# Patient Record
Sex: Male | Born: 1955 | Race: White | Hispanic: No | Marital: Married | Smoking: Never smoker
Health system: Southern US, Community
[De-identification: ages and names within clinical notes are randomized; demographics above are authoritative.]

## PROBLEM LIST (undated history)

## (undated) DIAGNOSIS — H9192 Unspecified hearing loss, left ear: Secondary | ICD-10-CM

## (undated) DIAGNOSIS — R918 Other nonspecific abnormal finding of lung field: Secondary | ICD-10-CM

## (undated) DIAGNOSIS — M1990 Unspecified osteoarthritis, unspecified site: Secondary | ICD-10-CM

## (undated) DIAGNOSIS — J189 Pneumonia, unspecified organism: Secondary | ICD-10-CM

## (undated) DIAGNOSIS — C801 Malignant (primary) neoplasm, unspecified: Secondary | ICD-10-CM

## (undated) DIAGNOSIS — N179 Acute kidney failure, unspecified: Secondary | ICD-10-CM

## (undated) DIAGNOSIS — I10 Essential (primary) hypertension: Secondary | ICD-10-CM

## (undated) DIAGNOSIS — Z9289 Personal history of other medical treatment: Secondary | ICD-10-CM

## (undated) HISTORY — PX: TONSILLECTOMY: SUR1361

## (undated) HISTORY — PX: OTHER SURGICAL HISTORY: SHX169

---

## 2004-07-04 ENCOUNTER — Ambulatory Visit (HOSPITAL_COMMUNITY): Admission: RE | Admit: 2004-07-04 | Discharge: 2004-07-04 | Payer: Self-pay | Admitting: Family Medicine

## 2014-07-13 ENCOUNTER — Other Ambulatory Visit: Payer: Self-pay | Admitting: Urology

## 2014-07-20 NOTE — Patient Instructions (Addendum)
Jaime Singh  07/20/2014   Your procedure is scheduled on:   -07-29-2014 Friday  Enter through [facility]  Entrance and follow signs to [facility]. Arrive at       0900 AM.  Call this number if you have problems the morning of surgery: [phone]  Or Presurgical Testing [phone].   For Living Will and/or Health Care Power Attorney Forms: please provide copy for your medical record,may bring AM of surgery(Forms should be already notarized -we do not provide this service).(07-21-14  No information preferred today-in process of this).  Remember: Follow any bowel prep instructions per MD office.    Do not eat food/ or drink: After Midnight.     Take these medicines the morning of surgery with A SIP OF WATER: None.   Do not wear jewelry, make-up or nail polish.  Do not wear deodorant, lotions, powders, or perfumes.   Do not shave legs and under arms- 48 hours(2 days) prior to first CHG shower.(Shaving face and neck okay.)  Do not bring valuables to the hospital.(Hospital is not responsible for lost valuables).  Contacts, dentures or removable bridgework, body piercing, hair pins may not be worn into surgery.  Leave suitcase in the car. After surgery it may be brought to your room.  For patients admitted to the hospital, checkout time is 11:00 AM the day of discharge.(Restricted visitors-Any Persons displaying flu-like symptoms or illness).    Patients discharged the day of surgery will not be allowed to drive home. Must have responsible person with you x 24 hours once discharged.  Name and phone number of your driver: Pauline-spouse [phone] cell     Please read over the following fact sheets that you were given:  CHG(Chlorhexidine Gluconate 4% Surgical Soap) use, MRSA Information, Blood Transfusion fact sheet, Incentive Spirometry Instruction.           [place] - Preparing for Surgery Before surgery, you can play an important role.  Because skin is  not sterile, your skin needs to be as free of germs as possible.  You can reduce the number of germs on your skin by washing with CHG (chlorahexidine gluconate) soap before surgery.  CHG is an antiseptic cleaner which kills germs and bonds with the skin to continue killing germs even after washing. Please DO NOT use if you have an allergy to CHG or antibacterial soaps.  If your skin becomes reddened/irritated stop using the CHG and inform your nurse when you arrive at Short Stay. Do not shave (including legs and underarms) for at least 48 hours prior to the first CHG shower.  You may shave your face/neck. Please follow these instructions carefully:  1.  Shower with CHG Soap the night before surgery and the  morning of Surgery.  2.  If you choose to wash your hair, wash your hair first as usual with your  normal  shampoo.  3.  After you shampoo, rinse your hair and body thoroughly to remove the  shampoo.                           4.  Use CHG as you would any other liquid soap.  You can apply chg directly  to the skin and wash                       Gently with a scrungie or clean washcloth.  5.  Apply the  CHG Soap to your body ONLY FROM THE NECK DOWN.   Do not use on face/ open                           Wound or open sores. Avoid contact with eyes, ears mouth and genitals (private parts).                       Wash face,  Genitals (private parts) with your normal soap.             6.  Wash thoroughly, paying special attention to the area where your surgery  will be performed.  7.  Thoroughly rinse your body with warm water from the neck down.  8.  DO NOT shower/wash with your normal soap after using and rinsing off  the CHG Soap.                9.  Pat yourself dry with a clean towel.            10.  Wear clean pajamas.            11.  Place clean sheets on your bed the night of your first shower and do not  sleep with pets. Day of Surgery : Do not apply any lotions/deodorants the morning of surgery.   Please wear clean clothes to the hospital/surgery center.  FAILURE TO FOLLOW THESE INSTRUCTIONS MAY RESULT IN THE CANCELLATION OF YOUR SURGERY PATIENT SIGNATURE_________________________________  NURSE SIGNATURE__________________________________  ________________________________________________________________________   Adam Phenix  An incentive spirometer is a tool that can help keep your lungs clear and active. This tool measures how well you are filling your lungs with each breath. Taking long deep breaths may help reverse or decrease the chance of developing breathing (pulmonary) problems (especially infection) following:  A long period of time when you are unable to move or be active. BEFORE THE PROCEDURE   If the spirometer includes an indicator to show your best effort, your nurse or respiratory therapist will set it to a desired goal.  If possible, sit up straight or lean slightly forward. Try not to slouch.  Hold the incentive spirometer in an upright position. INSTRUCTIONS FOR USE  1. Sit on the edge of your bed if possible, or sit up as far as you can in bed or on a chair. 2. Hold the incentive spirometer in an upright position. 3. Breathe out normally. 4. Place the mouthpiece in your mouth and seal your lips tightly around it. 5. Breathe in slowly and as deeply as possible, raising the piston or the ball toward the top of the column. 6. Hold your breath for 3-5 seconds or for as long as possible. Allow the piston or ball to fall to the bottom of the column. 7. Remove the mouthpiece from your mouth and breathe out normally. 8. Rest for a few seconds and repeat Steps 1 through 7 at least 10 times every 1-2 hours when you are awake. Take your time and take a few normal breaths between deep breaths. 9. The spirometer may include an indicator to show your best effort. Use the indicator as a goal to work toward during each repetition. 10. After each set of 10 deep  breaths, practice coughing to be sure your lungs are clear. If you have an incision (the cut made at the time of surgery), support your incision when coughing by placing a pillow or rolled  up towels firmly against it. Once you are able to get out of bed, walk around indoors and cough well. You may stop using the incentive spirometer when instructed by your caregiver.  RISKS AND COMPLICATIONS  Take your time so you do not get dizzy or light-headed.  If you are in pain, you may need to take or ask for pain medication before doing incentive spirometry. It is harder to take a deep breath if you are having pain. AFTER USE  Rest and breathe slowly and easily.  It can be helpful to keep track of a log of your progress. Your caregiver can provide you with a simple table to help with this. If you are using the spirometer at home, follow these instructions: [place] IF:   You are having difficultly using the spirometer.  You have trouble using the spirometer as often as instructed.  Your pain medication is not giving enough relief while using the spirometer.  You develop fever of 100.5 F (38.1 C) or higher. SEEK IMMEDIATE MEDICAL CARE IF:   You cough up bloody sputum that had not been present before.  You develop fever of 102 F (38.9 C) or greater.  You develop worsening pain at or near the incision site. MAKE SURE YOU:   Understand these instructions.  Will watch your condition.  Will get help right away if you are not doing well or get worse. Document Released: 09/23/2006 Document Revised: 08/05/2011 Document Reviewed: 11/24/2006 [facility] Patient Information 2014 [place], [place].   ________________________________________________________________________

## 2014-07-21 ENCOUNTER — Encounter (HOSPITAL_COMMUNITY)
Admission: RE | Admit: 2014-07-21 | Discharge: 2014-07-21 | Disposition: A | Discharge status: Home / Self Care | Payer: BLUE CROSS/BLUE SHIELD | Source: Ambulatory Visit | Attending: Urology | Admitting: Urology

## 2014-07-21 ENCOUNTER — Encounter (HOSPITAL_COMMUNITY): Payer: Self-pay

## 2014-07-21 ENCOUNTER — Other Ambulatory Visit: Payer: Self-pay

## 2014-07-21 DIAGNOSIS — Z01818 Encounter for other preprocedural examination: Secondary | ICD-10-CM | POA: Insufficient documentation

## 2014-07-21 DIAGNOSIS — C679 Malignant neoplasm of bladder, unspecified: Secondary | ICD-10-CM | POA: Diagnosis not present

## 2014-07-21 HISTORY — DX: Other nonspecific abnormal finding of lung field: R91.8

## 2014-07-21 HISTORY — DX: Pneumonia, unspecified organism: J18.9

## 2014-07-21 HISTORY — DX: Essential (primary) hypertension: I10

## 2014-07-21 HISTORY — DX: Unspecified osteoarthritis, unspecified site: M19.90

## 2014-07-21 HISTORY — DX: Personal history of other medical treatment: Z92.89

## 2014-07-21 HISTORY — DX: Unspecified hearing loss, left ear: H91.92

## 2014-07-21 LAB — CBC
HEMATOCRIT: 45.2 % (ref 39.0–52.0)
HEMOGLOBIN: 15.5 g/dL (ref 13.0–17.0)
MCH: 30.3 pg (ref 26.0–34.0)
MCHC: 34.3 g/dL (ref 30.0–36.0)
MCV: 88.5 fL (ref 78.0–100.0)
Platelets: 404 10*3/uL — ABNORMAL HIGH (ref 150–400)
RBC: 5.11 MIL/uL (ref 4.22–5.81)
RDW: 12.2 % (ref 11.5–15.5)
WBC: 12.2 10*3/uL — ABNORMAL HIGH (ref 4.0–10.5)

## 2014-07-21 LAB — BASIC METABOLIC PANEL
ANION GAP: 9 (ref 5–15)
BUN: 20 mg/dL (ref 6–23)
CALCIUM: 9.2 mg/dL (ref 8.4–10.5)
CO2: 27 mmol/L (ref 19–32)
CREATININE: 1.29 mg/dL (ref 0.50–1.35)
Chloride: 101 mmol/L (ref 96–112)
GFR, EST AFRICAN AMERICAN: 68 mL/min — AB (ref >90)
GFR, EST NON AFRICAN AMERICAN: 59 mL/min — AB (ref >90)
Glucose, Bld: 104 mg/dL — ABNORMAL HIGH (ref 70–99)
Potassium: 4.2 mmol/L (ref 3.5–5.1)
SODIUM: 137 mmol/L (ref 135–145)

## 2014-07-21 NOTE — Pre-Procedure Instructions (Addendum)
07-22-14 CT Chest 07-12-14 report with chart. EKG done today- Dr. Imogene Burn reviewed copy"okay". Dr. Jillyn Hidden made aware of Blood pressure today-wishes pt to continue blood pressure med all days but hold on surgery AM.

## 2014-07-21 NOTE — Progress Notes (Signed)
07-21-14 1525 Your Pt has screened with an elevated risk for obstructive sleep apnea using the Stop-Bang tool during a presurgical  Visit. A score of four or greater is an elevated risk.

## 2014-07-21 NOTE — Progress Notes (Signed)
CBC done 07/21/2014 faxed via EPIC to Dr Gaynelle Arabian.

## 2014-07-22 NOTE — Progress Notes (Signed)
CBC done 07/21/14 faxed via EPIC to Dr Gaynelle Arabian.

## 2014-07-22 NOTE — Progress Notes (Signed)
Final EKG done 07/21/14 in EPIC.

## 2014-07-28 MED ORDER — DEXTROSE 5 % IV SOLN
3.0000 g | INTRAVENOUS | Status: AC
  Administered 2014-07-29: 3 g via INTRAVENOUS
  Filled 2014-07-28 (×2): qty 3000

## 2014-07-29 ENCOUNTER — Encounter (HOSPITAL_COMMUNITY)
Admission: RE | Disposition: A | Discharge status: Home / Self Care | Payer: Self-pay | Source: Ambulatory Visit | Attending: Urology

## 2014-07-29 ENCOUNTER — Ambulatory Visit (HOSPITAL_COMMUNITY): Payer: BLUE CROSS/BLUE SHIELD | Admitting: Anesthesiology

## 2014-07-29 ENCOUNTER — Encounter (HOSPITAL_COMMUNITY): Payer: Self-pay | Admitting: *Deleted

## 2014-07-29 ENCOUNTER — Observation Stay (HOSPITAL_COMMUNITY)
Admission: RE | Admit: 2014-07-29 | Discharge: 2014-07-30 | Disposition: A | Discharge status: Home / Self Care | Payer: BLUE CROSS/BLUE SHIELD | Source: Ambulatory Visit | Attending: Urology | Admitting: Urology

## 2014-07-29 DIAGNOSIS — I10 Essential (primary) hypertension: Secondary | ICD-10-CM | POA: Diagnosis not present

## 2014-07-29 DIAGNOSIS — M1990 Unspecified osteoarthritis, unspecified site: Secondary | ICD-10-CM | POA: Insufficient documentation

## 2014-07-29 DIAGNOSIS — Z6841 Body Mass Index (BMI) 40.0 and over, adult: Secondary | ICD-10-CM | POA: Insufficient documentation

## 2014-07-29 DIAGNOSIS — C671 Malignant neoplasm of dome of bladder: Secondary | ICD-10-CM | POA: Diagnosis present

## 2014-07-29 DIAGNOSIS — Z79899 Other long term (current) drug therapy: Secondary | ICD-10-CM | POA: Insufficient documentation

## 2014-07-29 DIAGNOSIS — C670 Malignant neoplasm of trigone of bladder: Secondary | ICD-10-CM | POA: Diagnosis not present

## 2014-07-29 DIAGNOSIS — E780 Pure hypercholesterolemia: Secondary | ICD-10-CM | POA: Diagnosis not present

## 2014-07-29 DIAGNOSIS — Z7982 Long term (current) use of aspirin: Secondary | ICD-10-CM | POA: Diagnosis not present

## 2014-07-29 DIAGNOSIS — C679 Malignant neoplasm of bladder, unspecified: Secondary | ICD-10-CM | POA: Diagnosis present

## 2014-07-29 HISTORY — PX: TRANSURETHRAL RESECTION OF BLADDER TUMOR: SHX2575

## 2014-07-29 SURGERY — TURBT (TRANSURETHRAL RESECTION OF BLADDER TUMOR)
Anesthesia: General

## 2014-07-29 MED ORDER — FENTANYL CITRATE 0.05 MG/ML IJ SOLN
INTRAMUSCULAR | Status: AC
  Filled 2014-07-29: qty 2

## 2014-07-29 MED ORDER — PROPOFOL 10 MG/ML IV BOLUS
INTRAVENOUS | Status: AC
  Filled 2014-07-29: qty 20

## 2014-07-29 MED ORDER — OXYCODONE-ACETAMINOPHEN 5-325 MG PO TABS
1.0000 | ORAL_TABLET | ORAL | Status: DC | PRN
  Administered 2014-07-29: 1 via ORAL
  Administered 2014-07-30 (×2): 2 via ORAL
  Filled 2014-07-29 (×2): qty 2
  Filled 2014-07-29: qty 1

## 2014-07-29 MED ORDER — LISINOPRIL 10 MG PO TABS
10.0000 mg | ORAL_TABLET | Freq: Every day | ORAL | Status: DC
  Administered 2014-07-29: 10 mg via ORAL
  Filled 2014-07-29: qty 1

## 2014-07-29 MED ORDER — BELLADONNA ALKALOIDS-OPIUM 16.2-60 MG RE SUPP
RECTAL | Status: AC
  Filled 2014-07-29: qty 1

## 2014-07-29 MED ORDER — PHENAZOPYRIDINE HCL 200 MG PO TABS: 200.0000 mg | ORAL_TABLET | Freq: Three times a day (TID) | ORAL | Status: DC | PRN

## 2014-07-29 MED ORDER — STERILE WATER FOR IRRIGATION IR SOLN
Status: DC | PRN
  Administered 2014-07-29: 6000 mL

## 2014-07-29 MED ORDER — ACETAMINOPHEN 325 MG PO TABS: 650.0000 mg | ORAL_TABLET | ORAL | Status: DC | PRN

## 2014-07-29 MED ORDER — TRAMADOL-ACETAMINOPHEN 37.5-325 MG PO TABS: 1.0000 | ORAL_TABLET | Freq: Four times a day (QID) | ORAL | Status: AC | PRN

## 2014-07-29 MED ORDER — HYDROMORPHONE HCL 1 MG/ML IJ SOLN
INTRAMUSCULAR | Status: AC
  Filled 2014-07-29: qty 1

## 2014-07-29 MED ORDER — HYDROMORPHONE HCL 1 MG/ML IJ SOLN
0.2500 mg | INTRAMUSCULAR | Status: DC | PRN
  Administered 2014-07-29 (×2): 0.5 mg via INTRAVENOUS
  Administered 2014-07-29 (×2): 0.25 mg via INTRAVENOUS
  Administered 2014-07-29: 0.5 mg via INTRAVENOUS

## 2014-07-29 MED ORDER — DIPHENHYDRAMINE HCL 12.5 MG/5ML PO ELIX: 12.5000 mg | ORAL_SOLUTION | Freq: Four times a day (QID) | ORAL | Status: DC | PRN

## 2014-07-29 MED ORDER — SODIUM CHLORIDE 0.9 % IR SOLN
Status: DC | PRN
  Administered 2014-07-29: 15000 mL

## 2014-07-29 MED ORDER — ONDANSETRON HCL 4 MG/2ML IJ SOLN: 4.0000 mg | INTRAMUSCULAR | Status: DC | PRN

## 2014-07-29 MED ORDER — ZOLPIDEM TARTRATE 5 MG PO TABS: 5.0000 mg | ORAL_TABLET | Freq: Every evening | ORAL | Status: DC | PRN

## 2014-07-29 MED ORDER — MIDAZOLAM HCL 5 MG/5ML IJ SOLN
INTRAMUSCULAR | Status: DC | PRN
  Administered 2014-07-29: 2 mg via INTRAVENOUS

## 2014-07-29 MED ORDER — MITOMYCIN CHEMO FOR BLADDER INSTILLATION 40 MG: 40.0000 mg | Freq: Once | INTRAVENOUS | Status: DC

## 2014-07-29 MED ORDER — HYDROMORPHONE HCL 1 MG/ML IJ SOLN
INTRAMUSCULAR | Status: AC
Start: 2014-07-29 — End: 2014-07-30
  Filled 2014-07-29: qty 1

## 2014-07-29 MED ORDER — FENTANYL CITRATE 0.05 MG/ML IJ SOLN
INTRAMUSCULAR | Status: DC | PRN
  Administered 2014-07-29 (×4): 50 ug via INTRAVENOUS

## 2014-07-29 MED ORDER — LISINOPRIL-HYDROCHLOROTHIAZIDE 10-12.5 MG PO TABS
1.0000 | ORAL_TABLET | Freq: Every day | ORAL | Status: DC
Start: 2014-07-29 — End: 2014-07-29

## 2014-07-29 MED ORDER — SENNOSIDES-DOCUSATE SODIUM 8.6-50 MG PO TABS
2.0000 | ORAL_TABLET | Freq: Every day | ORAL | Status: DC
  Administered 2014-07-29: 2 via ORAL
  Filled 2014-07-29: qty 2

## 2014-07-29 MED ORDER — OXYBUTYNIN CHLORIDE 5 MG PO TABS
5.0000 mg | ORAL_TABLET | Freq: Three times a day (TID) | ORAL | Status: DC | PRN
  Administered 2014-07-29: 5 mg via ORAL

## 2014-07-29 MED ORDER — SODIUM CHLORIDE 0.45 % IV SOLN
INTRAVENOUS | Status: DC
  Administered 2014-07-29 – 2014-07-30 (×2): via INTRAVENOUS

## 2014-07-29 MED ORDER — PROPOFOL 10 MG/ML IV BOLUS
INTRAVENOUS | Status: DC | PRN
  Administered 2014-07-29: 150 mg via INTRAVENOUS

## 2014-07-29 MED ORDER — OXYBUTYNIN CHLORIDE 5 MG PO TABS
ORAL_TABLET | ORAL | Status: AC
  Filled 2014-07-29: qty 1

## 2014-07-29 MED ORDER — CIPROFLOXACIN HCL 250 MG PO TABS
250.0000 mg | ORAL_TABLET | Freq: Two times a day (BID) | ORAL | Status: DC
  Administered 2014-07-29 – 2014-07-30 (×2): 250 mg via ORAL
  Filled 2014-07-29 (×2): qty 1

## 2014-07-29 MED ORDER — LACTATED RINGERS IV SOLN
INTRAVENOUS | Status: DC
  Administered 2014-07-29: 1000 mL via INTRAVENOUS
  Administered 2014-07-29: 12:00:00 via INTRAVENOUS

## 2014-07-29 MED ORDER — DIPHENHYDRAMINE HCL 50 MG/ML IJ SOLN: 12.5000 mg | Freq: Four times a day (QID) | INTRAMUSCULAR | Status: DC | PRN

## 2014-07-29 MED ORDER — LIDOCAINE HCL 2 % EX GEL
CUTANEOUS | Status: AC
  Filled 2014-07-29: qty 10

## 2014-07-29 MED ORDER — TRIMETHOPRIM 100 MG PO TABS: 100.0000 mg | ORAL_TABLET | ORAL | Status: DC

## 2014-07-29 MED ORDER — BELLADONNA ALKALOIDS-OPIUM 16.2-60 MG RE SUPP
RECTAL | Status: DC | PRN
  Administered 2014-07-29: 1 via RECTAL

## 2014-07-29 MED ORDER — MIDAZOLAM HCL 2 MG/2ML IJ SOLN
INTRAMUSCULAR | Status: AC
  Filled 2014-07-29: qty 2

## 2014-07-29 MED ORDER — HYDROMORPHONE HCL 1 MG/ML IJ SOLN
0.5000 mg | INTRAMUSCULAR | Status: DC | PRN
  Administered 2014-07-29 – 2014-07-30 (×2): 1 mg via INTRAVENOUS
  Filled 2014-07-29 (×2): qty 1

## 2014-07-29 MED ORDER — HYDROCHLOROTHIAZIDE 12.5 MG PO CAPS
12.5000 mg | ORAL_CAPSULE | Freq: Every day | ORAL | Status: DC
  Administered 2014-07-29: 12.5 mg via ORAL
  Filled 2014-07-29: qty 1

## 2014-07-29 MED ORDER — BACITRACIN-NEOMYCIN-POLYMYXIN 400-5-5000 EX OINT: 1.0000 "application " | TOPICAL_OINTMENT | Freq: Three times a day (TID) | CUTANEOUS | Status: DC | PRN

## 2014-07-29 SURGICAL SUPPLY — 26 items
BAG URINE DRAINAGE (UROLOGICAL SUPPLIES) ×3 IMPLANT
BAG URO CATCHER STRL LF (DRAPE) ×3 IMPLANT
CATH HEMA 3WAY 30CC 22FR COUDE (CATHETERS) ×3 IMPLANT
CATH ROBINSON RED A/P 14FR (CATHETERS) IMPLANT
CLOTH BEACON ORANGE TIMEOUT ST (SAFETY) ×3 IMPLANT
DEFLUX NEEDLE (NEEDLE) IMPLANT
DEFLUX SYRINGE (SYRINGE) IMPLANT
GLOVE BIOGEL M STRL SZ7.5 (GLOVE) ×3 IMPLANT
GOWN STRL REUS W/TWL LRG LVL3 (GOWN DISPOSABLE) ×3 IMPLANT
GOWN STRL REUS W/TWL XL LVL3 (GOWN DISPOSABLE) ×3 IMPLANT
HOLDER FOLEY CATH W/STRAP (MISCELLANEOUS) IMPLANT
IV NS IRRIG 3000ML ARTHROMATIC (IV SOLUTION) IMPLANT
KIT ASPIRATION TUBING (SET/KITS/TRAYS/PACK) IMPLANT
LOOP CUT BIPOLAR 24F LRG (ELECTROSURGICAL) ×3 IMPLANT
MANIFOLD NEPTUNE II (INSTRUMENTS) ×3 IMPLANT
NDL SAFETY ECLIPSE 18X1.5 (NEEDLE) IMPLANT
NEEDLE HYPO 18GX1.5 SHARP (NEEDLE)
NEEDLE SPNL 22GX7 QUINCKE BK (NEEDLE) ×3 IMPLANT
NS IRRIG 1000ML POUR BTL (IV SOLUTION) IMPLANT
PACK CYSTO (CUSTOM PROCEDURE TRAY) ×3 IMPLANT
SCRUB PCMX 4 OZ (MISCELLANEOUS) IMPLANT
SYR CONTROL 10ML LL (SYRINGE) IMPLANT
SYRINGE IRR TOOMEY STRL 70CC (SYRINGE) ×3 IMPLANT
TUBING CONNECTING 10 (TUBING) ×2 IMPLANT
TUBING CONNECTING 10' (TUBING) ×1
WATER STERILE IRR 3000ML UROMA (IV SOLUTION) IMPLANT

## 2014-07-29 NOTE — Progress Notes (Signed)
Urology Progress Note  Day of Surgery   Subjective:     No acute urologic events, Urine pink. Discussed surgery. No r3eason for massive bladder tumor. Note no tobacco exposure. Tumor in dome. ? [place]eno or poorly differentiated TCC, or met from colon? Ambulation:   positive Flatus:    negative Bowel movement  negative  Pain: some relief  Objective:  Blood pressure 142/78, pulse 62, temperature 97.6 F (36.4 C), temperature source Oral, resp. rate 15, height _0  (1.727 m), weight 120.884 kg (266 lb 8 oz), SpO2 98 %.  Physical Exam:  General:  No acute distress, awake Resp: clear to auscultation bilaterally Genitourinary:  normal Foley:  Pink urine. No clots.       No results for input(s): HGB, WBC, PLT in the last 72 hours.  No results for input(s): NA, K, CL, CO2, BUN, CREATININE, CALCIUM, GFRNONAA, GFRAA in the last 72 hours.  Invalid input(s): MAGNESIUM   No results for input(s): INR, APTT in the last 72 hours.  Invalid input(s): PT   Invalid input(s): ABG  Assessment/Plan:  Plan for d/c in AM RTC Monday for cath removal.

## 2014-07-29 NOTE — Anesthesia Procedure Notes (Signed)
Procedure Name: LMA Insertion Date/Time: 07/29/2014 11:21 AM Performed by: Johnathan Hausen A Pre-anesthesia Checklist: Patient identified, Emergency Drugs available, Suction available, Patient being monitored and Timeout performed Patient Re-evaluated:Patient Re-evaluated prior to inductionOxygen Delivery Method: Circle system utilized Preoxygenation: Pre-oxygenation with 100% oxygen Intubation Type: Combination inhalational/ intravenous induction Ventilation: Mask ventilation without difficulty LMA: LMA with gastric port inserted LMA Size: 5.0 Number of attempts: 1 Placement Confirmation: positive ETCO2 and breath sounds checked- equal and bilateral Tube secured with: Tape Dental Injury: Teeth and Oropharynx as per pre-operative assessment
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
## 2014-07-29 NOTE — Interval H&P Note (Signed)
History and Physical Interval Note:  07/29/2014 11:13 AM  Jaime Singh  has presented today for surgery, with the diagnosis of BLADDER TUMOR  The various methods of treatment have been discussed with the patient and family. After consideration of risks, benefits and other options for treatment, the patient has consented to  Procedure(s): CYSTOSCOPY WITH MITOMYCIN C  (N/A) TRANSURETHRAL RESECTION OF BLADDER TUMOR (TURBT)  (N/A) as a surgical intervention .  The patient's history has been reviewed, patient examined, no change in status, stable for surgery.  I have reviewed the patient's chart and labs.  Questions were answered to the patient's satisfaction.     Carolan Clines I

## 2014-07-29 NOTE — Transfer of Care (Signed)
Immediate Anesthesia Transfer of Care Note  Patient: Jaime Singh  Procedure(s) Performed: Procedure(s): TRANSURETHRAL RESECTION OF BLADDER TUMOR (TURBT)  (N/A)  Patient Location: PACU  Anesthesia Type:General  Level of Consciousness: awake, sedated and patient cooperative  Airway & Oxygen Therapy: Patient Spontanous Breathing and Patient connected to face mask oxygen  Post-op Assessment: Report given to RN and Post -op Vital signs reviewed and stable  Post vital signs: Reviewed and stable  Last Vitals:  Filed Vitals:   07/29/14 0833  BP: 174/90  Pulse: 81  Temp: 36.3 C  Resp: 18    Complications: No apparent anesthesia complications
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
## 2014-07-29 NOTE — Anesthesia Postprocedure Evaluation (Signed)
  Anesthesia Post-op Note  Patient: Jaime Singh  Procedure(s) Performed: Procedure(s) (LRB): TRANSURETHRAL RESECTION OF BLADDER TUMOR (TURBT)  (N/A)  Patient Location: PACU  Anesthesia Type: General  Level of Consciousness: awake and alert   Airway and Oxygen Therapy: Patient Spontanous Breathing  Post-op Pain: mild  Post-op Assessment: Post-op Vital signs reviewed, Patient's Cardiovascular Status Stable, Respiratory Function Stable, Patent Airway and No signs of Nausea or vomiting  Last Vitals:  Filed Vitals:   07/29/14 1517  BP: 142/78  Pulse: 62  Temp: 36.4 C  Resp: 15    Post-op Vital Signs: stable   Complications: No apparent anesthesia complications

## 2014-07-29 NOTE — Anesthesia Preprocedure Evaluation (Addendum)
Anesthesia Evaluation  Patient identified by MRN, date of birth, ID band Patient awake    Reviewed: Allergy & Precautions, NPO status , Patient's Chart, lab work & pertinent test results  Airway Mallampati: II  TM Distance: >3 FB Neck ROM: Full    Dental no notable dental hx. (+) Dental Advisory Given,    Pulmonary pneumonia -, resolved,  breath sounds clear to auscultation  Pulmonary exam normal       Cardiovascular Exercise Tolerance: Good hypertension, Pt. on medications Rhythm:Regular Rate:Normal     Neuro/Psych negative neurological ROS  negative psych ROS   GI/Hepatic negative GI ROS, Neg liver ROS,   Endo/Other  Morbid obesity  Renal/GU negative Renal ROS  negative genitourinary   Musculoskeletal  (+) Arthritis -,   Abdominal (+) + obese,   Peds negative pediatric ROS (+)  Hematology negative hematology ROS (+)   Anesthesia Other Findings   Reproductive/Obstetrics negative OB ROS                            Anesthesia Physical Anesthesia Plan  ASA: III  Anesthesia Plan: General   Post-op Pain Management:    Induction: Intravenous  Airway Management Planned: LMA  Additional Equipment:   Intra-op Plan:   Post-operative Plan: Extubation in OR  Informed Consent: I have reviewed the patients History and Physical, chart, labs and discussed the procedure including the risks, benefits and alternatives for the proposed anesthesia with the patient or authorized representative who has indicated his/her understanding and acceptance.   Dental advisory given  Plan Discussed with: CRNA  Anesthesia Plan Comments:         Anesthesia Quick Evaluation

## 2014-07-29 NOTE — Discharge Instructions (Signed)
Bladder Cancer Bladder cancer is an abnormal growth of tissue in your bladder. Your bladder is the balloon-like sac in your pelvis. It collects and stores urine that comes from the kidneys through the ureters. The bladder wall is made of layers. If cancer spreads into these layers and through the wall of the bladder, it becomes more difficult to treat.  There are four stages of bladder cancer:  Stage I. Cancer at this stage occurs in the bladder's inner lining but has not invaded the muscular bladder wall.  Stage II. At this stage, cancer has invaded the bladder wall but is still confined to the bladder.  Stage III. By this stage, the cancer cells have spread through the bladder wall to surrounding tissue. They may also have spread to the prostate in men or the uterus or vagina in women.  Stage IV. By this stage, cancer cells may have spread to the lymph nodes and other organs, such as your lungs, bones, or liver. RISK FACTORS Although the cause of bladder cancer is not known, the following risk factors can increase your chances of getting bladder cancer:   Smoking.   Occupational exposures, such as rubber, leather, textile, dyes, chemicals, and paint.  Being white.  Age.   Being male.   Having chronic bladder inflammation.   Having a bladder cancer history.   Having a family history of bladder cancer (heredity).   Having had chemotherapy or radiation therapy to the pelvis.   Being exposed to arsenic.  SYMPTOMS   Blood in the urine.   Pain with urination.   Frequent bladder or urine infections.  Increase in urgency and frequency of urination. DIAGNOSIS  Your health care provider may suspect bladder cancer based on your description of urinary symptoms or based on the finding of blood or infection in the urine (especially if this has recurred several times). Other tests or procedures that may be performed include:   A narrow tube being inserted into your bladder  through your urethra (cystoscopy) in order to view the lining of your bladder for tumors.   A biopsy to sample the tumor to see if cancer is present.  If cancer is present, it will then be staged to determine its severity and extent. It is important to know how deeply into the bladder wall the cancer has grown and whether the cancer has spread to any other parts of your body. Staging may require blood tests or special scans such as a CT scan, MRI, bone scan, or chest X-ray.  TREATMENT  Once your cancer has been diagnosed and staged, you should discuss a treatment plan with your health care provider. Based on the stage of the cancer, one treatment or a combination of treatments may be recommended. The most common forms of treatment are:   Surgery. Procedures that may be done include transurethral resection and cystectomy.  Radiation therapy. This is infrequently used to treat bladder cancer.   Chemotherapy. During this treatment, drugs are used to kill cancer cells.  Immunotherapy. This is usually administered directly into the bladder. HOME CARE INSTRUCTIONS  Take medicines only as directed by your health care provider.   Maintain a healthy diet.   Consider joining a support group. This may help you learn to cope with the stress of having bladder cancer.   Seek advice to help you manage treatment side effects.   Keep all follow-up visits as directed by your health care provider.   Inform your cancer specialist if you are  stress of having bladder cancer.    · Seek advice to help you manage treatment side effects.    · Keep all follow-up visits as directed by your health care provider.    · Inform your cancer specialist if you are admitted to the hospital.    SEEK MEDICAL CARE IF:  · There is blood in your urine.  · You have symptoms of a urinary tract infection. These include:  ¨ Tiredness.  ¨ Shakiness.  ¨ Weakness.  ¨ Muscle aches.  ¨ Abdominal pain.  ¨ Frequent and intense urge to urinate (in young women).  ¨ Burning feeling in the bladder or urethra during urination (in young women).  SEEK IMMEDIATE MEDICAL CARE IF:   You are unable to urinate.  Document Released: 05/16/2003 Document  Revised: 09/27/2013 Document Reviewed: 11/03/2012  Exit[employer]® Patient Information ©2015 [employer]. This information is not intended to replace advice given to you by your health care provider. Make sure you discuss any questions you have with your health care provider.

## 2014-07-30 DIAGNOSIS — C671 Malignant neoplasm of dome of bladder: Secondary | ICD-10-CM | POA: Diagnosis not present

## 2014-07-30 NOTE — Discharge Summary (Signed)
  Physician Discharge Summary  Patient ID: Jaime Singh MRN: [id number] DOB/AGE: 1956-04-27 59 y.o.  Admit date: 07/29/2014 Discharge date: 07/30/2014  Admission Diagnoses: BLADDER TUMOR  Discharge Diagnoses:  Active Problems:   Bladder cancer   Discharged Condition: good  Hospital Course: He was admitted to the hospital for transurethral resection of a large bladder tumor. This proceeded without difficulty. Because of the large size of the tumor he was maintained on Foley catheterization with continuous bladder irrigation. His urine has remained clear and he has not had any clots. He has no complaints and is felt ready for discharge home with the Foley catheter indwelling.  Significant Diagnostic Studies: No results found.  Discharge Exam: Blood pressure 122/60, pulse 66, temperature 98.3 F (36.8 C), temperature source Oral, resp. rate 16, height 5\' 8"  (1.727 m), weight 120.884 kg (266 lb 8 oz), SpO2 94 %. Abdomen is soft and nontender.  Foley catheter is indwelling and draining clear with no clots.   Disposition: Final discharge disposition not confirmed  Discharge Instructions    Continue foley catheter    Complete by:  As directed      Discharge patient    Complete by:  As directed             Medication List    TAKE these medications        aspirin EC 81 MG tablet  Take 81 mg by mouth daily.     lisinopril-hydrochlorothiazide 10-12.5 MG per tablet  Commonly known as:  PRINZIDE,ZESTORETIC  Take 1 tablet by mouth daily with lunch.     phenazopyridine 200 MG tablet  Commonly known as:  PYRIDIUM  Take 1 tablet (200 mg total) by mouth 3 (three) times daily as needed for pain.     traMADol-acetaminophen 37.5-325 MG per tablet  Commonly known as:  ULTRACET  Take 1 tablet by mouth every 6 (six) hours as needed.     trimethoprim 100 MG tablet  Commonly known as:  TRIMPEX  Take 1 tablet (100 mg total) by mouth 1 day or 1 dose.     vitamin C 500 MG tablet   Commonly known as:  ASCORBIC ACID  Take 500 mg by mouth daily.     vitamin E 400 UNIT capsule  Take 400 Units by mouth daily.     zinc gluconate 50 MG tablet  Take 50 mg by mouth daily.           Follow-up Information    Follow up with Carolan Clines I, MD.   Specialty:  Urology   Why:  foley removal Monday   Contact information:   [place] [address] [phone]       Signed: [name] 07/30/2014, 7:02 AM

## 2014-07-30 NOTE — Progress Notes (Signed)
UR completed 

## 2014-08-01 ENCOUNTER — Encounter (HOSPITAL_COMMUNITY): Payer: Self-pay | Admitting: Urology

## 2014-08-09 ENCOUNTER — Other Ambulatory Visit (HOSPITAL_COMMUNITY): Payer: Self-pay | Admitting: Urology

## 2014-08-09 DIAGNOSIS — R911 Solitary pulmonary nodule: Secondary | ICD-10-CM

## 2014-08-09 DIAGNOSIS — C781 Secondary malignant neoplasm of mediastinum: Secondary | ICD-10-CM

## 2014-08-09 DIAGNOSIS — C679 Malignant neoplasm of bladder, unspecified: Secondary | ICD-10-CM

## 2014-08-12 ENCOUNTER — Telehealth: Payer: Self-pay | Admitting: *Deleted

## 2014-08-12 ENCOUNTER — Ambulatory Visit (HOSPITAL_BASED_OUTPATIENT_CLINIC_OR_DEPARTMENT_OTHER): Payer: BLUE CROSS/BLUE SHIELD | Admitting: Oncology

## 2014-08-12 ENCOUNTER — Encounter: Payer: Self-pay | Admitting: Oncology

## 2014-08-12 ENCOUNTER — Ambulatory Visit: Payer: BLUE CROSS/BLUE SHIELD

## 2014-08-12 ENCOUNTER — Other Ambulatory Visit: Payer: BLUE CROSS/BLUE SHIELD

## 2014-08-12 VITALS — BP 139/76 | HR 80 | Temp 98.4°F | Resp 18 | Ht 68.0 in | Wt 262.7 lb

## 2014-08-12 DIAGNOSIS — C679 Malignant neoplasm of bladder, unspecified: Secondary | ICD-10-CM

## 2014-08-12 DIAGNOSIS — C7A1 Malignant poorly differentiated neuroendocrine tumors: Secondary | ICD-10-CM

## 2014-08-12 DIAGNOSIS — R911 Solitary pulmonary nodule: Secondary | ICD-10-CM
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
## 2014-08-12 NOTE — Progress Notes (Signed)
Please see consult note.  

## 2014-08-12 NOTE — Progress Notes (Signed)
Checked in new pt with no financial concerns at this time.  Informed pt if chemo is part of his treatment I will contact his insurance to see if Josem Kaufmann is req and will obtain it if it is as well as contact foundations that offer copay assistance if needed.  He has my card for any billing questions or concerns.

## 2014-08-12 NOTE — Telephone Encounter (Signed)
Call received asking if there would be a change or modification in his chemotherapy if he received chemoterapy after surgery.  The surgeon said he could do surgery first.  I'm looking at my calendar planning things is why I'm calling.  This nurse informed him the treatment would not change.  Will notify provider of the call.

## 2014-08-22 ENCOUNTER — Other Ambulatory Visit: Payer: Self-pay | Admitting: Neurology

## 2014-08-22 ENCOUNTER — Ambulatory Visit (HOSPITAL_COMMUNITY)
Admission: RE | Admit: 2014-08-22 | Discharge: 2014-08-22 | Disposition: A | Discharge status: Home / Self Care | Payer: BLUE CROSS/BLUE SHIELD | Source: Ambulatory Visit | Attending: Neurology | Admitting: Neurology

## 2014-08-22 ENCOUNTER — Ambulatory Visit (HOSPITAL_COMMUNITY)
Admission: RE | Admit: 2014-08-22 | Discharge: 2014-08-22 | Disposition: A | Discharge status: Home / Self Care | Payer: BLUE CROSS/BLUE SHIELD | Source: Ambulatory Visit | Attending: Urology | Admitting: Urology

## 2014-08-22 ENCOUNTER — Other Ambulatory Visit (HOSPITAL_COMMUNITY): Payer: Self-pay | Admitting: Urology

## 2014-08-22 ENCOUNTER — Encounter (HOSPITAL_COMMUNITY)
Admission: RE | Admit: 2014-08-22 | Discharge: 2014-08-22 | Disposition: A | Discharge status: Home / Self Care | Payer: BLUE CROSS/BLUE SHIELD | Source: Ambulatory Visit | Attending: Urology | Admitting: Urology

## 2014-08-22 DIAGNOSIS — R911 Solitary pulmonary nodule: Secondary | ICD-10-CM

## 2014-08-22 DIAGNOSIS — C781 Secondary malignant neoplasm of mediastinum: Secondary | ICD-10-CM | POA: Diagnosis present

## 2014-08-22 DIAGNOSIS — C679 Malignant neoplasm of bladder, unspecified: Secondary | ICD-10-CM

## 2014-08-22 DIAGNOSIS — Z9889 Other specified postprocedural states: Secondary | ICD-10-CM

## 2014-08-22 DIAGNOSIS — Z1389 Encounter for screening for other disorder: Secondary | ICD-10-CM | POA: Insufficient documentation

## 2014-08-22 LAB — GLUCOSE, CAPILLARY: Glucose-Capillary: 112 mg/dL — ABNORMAL HIGH (ref 70–99)

## 2014-08-22 MED ORDER — FLUDEOXYGLUCOSE F - 18 (FDG) INJECTION: 12.3000 | Freq: Once | INTRAVENOUS | Status: AC | PRN

## 2014-08-24 DIAGNOSIS — C678 Malignant neoplasm of overlapping sites of bladder: Secondary | ICD-10-CM | POA: Diagnosis present

## 2014-08-25 ENCOUNTER — Other Ambulatory Visit (HOSPITAL_COMMUNITY): Payer: Self-pay | Admitting: Urology

## 2014-08-25 ENCOUNTER — Ambulatory Visit (HOSPITAL_COMMUNITY)
Admission: RE | Admit: 2014-08-25 | Discharge: 2014-08-25 | Disposition: A | Discharge status: Home / Self Care | Payer: BLUE CROSS/BLUE SHIELD | Source: Ambulatory Visit | Attending: Urology | Admitting: Urology

## 2014-08-25 DIAGNOSIS — C679 Malignant neoplasm of bladder, unspecified: Secondary | ICD-10-CM | POA: Insufficient documentation

## 2014-08-25 DIAGNOSIS — R911 Solitary pulmonary nodule: Secondary | ICD-10-CM

## 2014-08-25 DIAGNOSIS — G9389 Other specified disorders of brain: Secondary | ICD-10-CM | POA: Diagnosis not present

## 2014-08-25 DIAGNOSIS — C781 Secondary malignant neoplasm of mediastinum: Secondary | ICD-10-CM

## 2014-08-25 MED ORDER — GADOBENATE DIMEGLUMINE 529 MG/ML IV SOLN
20.0000 mL | Freq: Once | INTRAVENOUS | Status: AC | PRN
  Administered 2014-08-25: 20 mL via INTRAVENOUS

## 2015-08-30 ENCOUNTER — Other Ambulatory Visit: Payer: Self-pay | Admitting: Family Medicine

## 2015-08-30 ENCOUNTER — Ambulatory Visit
Admission: RE | Admit: 2015-08-30 | Discharge: 2015-08-30 | Disposition: A | Discharge status: Home / Self Care | Payer: BLUE CROSS/BLUE SHIELD | Source: Ambulatory Visit | Attending: Family Medicine | Admitting: Family Medicine

## 2015-08-30 DIAGNOSIS — M25551 Pain in right hip: Secondary | ICD-10-CM

## 2016-03-28 ENCOUNTER — Telehealth: Payer: Self-pay | Admitting: Internal Medicine
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
## 2016-03-29 ENCOUNTER — Observation Stay (HOSPITAL_COMMUNITY): Payer: BLUE CROSS/BLUE SHIELD

## 2016-03-29 ENCOUNTER — Encounter (HOSPITAL_COMMUNITY): Payer: Self-pay

## 2016-03-29 ENCOUNTER — Inpatient Hospital Stay (HOSPITAL_COMMUNITY)
Admission: AD | Admit: 2016-03-29 | Discharge: 2016-04-03 | DRG: 644 | Disposition: A | Discharge status: Home / Self Care | Payer: BLUE CROSS/BLUE SHIELD | Source: Other Acute Inpatient Hospital | Attending: Internal Medicine | Admitting: Internal Medicine

## 2016-03-29 DIAGNOSIS — H9192 Unspecified hearing loss, left ear: Secondary | ICD-10-CM | POA: Diagnosis present

## 2016-03-29 DIAGNOSIS — E871 Hypo-osmolality and hyponatremia: Secondary | ICD-10-CM | POA: Diagnosis present

## 2016-03-29 DIAGNOSIS — Z79899 Other long term (current) drug therapy: Secondary | ICD-10-CM | POA: Diagnosis not present

## 2016-03-29 DIAGNOSIS — C679 Malignant neoplasm of bladder, unspecified: Secondary | ICD-10-CM | POA: Diagnosis present

## 2016-03-29 DIAGNOSIS — D72829 Elevated white blood cell count, unspecified: Secondary | ICD-10-CM | POA: Diagnosis present

## 2016-03-29 DIAGNOSIS — E222 Syndrome of inappropriate secretion of antidiuretic hormone: Secondary | ICD-10-CM | POA: Diagnosis not present

## 2016-03-29 DIAGNOSIS — I10 Essential (primary) hypertension: Secondary | ICD-10-CM | POA: Diagnosis present

## 2016-03-29 DIAGNOSIS — Z9889 Other specified postprocedural states: Secondary | ICD-10-CM | POA: Diagnosis not present

## 2016-03-29 DIAGNOSIS — Z7982 Long term (current) use of aspirin: Secondary | ICD-10-CM | POA: Diagnosis not present

## 2016-03-29 DIAGNOSIS — I119 Hypertensive heart disease without heart failure: Secondary | ICD-10-CM | POA: Diagnosis present

## 2016-03-29 DIAGNOSIS — R918 Other nonspecific abnormal finding of lung field: Secondary | ICD-10-CM | POA: Diagnosis not present

## 2016-03-29 DIAGNOSIS — N179 Acute kidney failure, unspecified: Secondary | ICD-10-CM | POA: Diagnosis not present

## 2016-03-29 DIAGNOSIS — C678 Malignant neoplasm of overlapping sites of bladder: Secondary | ICD-10-CM | POA: Diagnosis not present

## 2016-03-29 DIAGNOSIS — Z8701 Personal history of pneumonia (recurrent): Secondary | ICD-10-CM

## 2016-03-29 HISTORY — DX: Malignant (primary) neoplasm, unspecified: C80.1

## 2016-03-29 HISTORY — DX: Acute kidney failure, unspecified: N17.9

## 2016-03-29 LAB — BASIC METABOLIC PANEL
Anion gap: 9 (ref 5–15)
Anion gap: 9 (ref 5–15)
BUN: 9 mg/dL (ref 6–20)
BUN: 9 mg/dL (ref 6–20)
CALCIUM: 8.9 mg/dL (ref 8.9–10.3)
CO2: 23 mmol/L (ref 22–32)
CO2: 22 mmol/L (ref 22–32)
CREATININE: 1 mg/dL (ref 0.61–1.24)
CREATININE: 0.96 mg/dL (ref 0.61–1.24)
Calcium: 8.9 mg/dL (ref 8.9–10.3)
Chloride: 83 mmol/L — ABNORMAL LOW (ref 101–111)
Chloride: 85 mmol/L — ABNORMAL LOW (ref 101–111)
Glucose, Bld: 106 mg/dL — ABNORMAL HIGH (ref 65–99)
Glucose, Bld: 98 mg/dL (ref 65–99)
Potassium: 4.3 mmol/L (ref 3.5–5.1)
Potassium: 4 mmol/L (ref 3.5–5.1)
SODIUM: 115 mmol/L — AB (ref 135–145)
SODIUM: 116 mmol/L — AB (ref 135–145)

## 2016-03-29 LAB — CBC WITH DIFFERENTIAL/PLATELET
BASOS PCT: 0 %
Basophils Absolute: 0 10*3/uL (ref 0.0–0.1)
EOS ABS: 0.1 10*3/uL (ref 0.0–0.7)
Eosinophils Relative: 1 %
HEMATOCRIT: 34.6 % — AB (ref 39.0–52.0)
HEMOGLOBIN: 12.8 g/dL — AB (ref 13.0–17.0)
LYMPHS ABS: 1.7 10*3/uL (ref 0.7–4.0)
Lymphocytes Relative: 16 %
MCH: 30 pg (ref 26.0–34.0)
MCHC: 37 g/dL — ABNORMAL HIGH (ref 30.0–36.0)
MCV: 81 fL (ref 78.0–100.0)
MONOS PCT: 9 %
Monocytes Absolute: 1 10*3/uL (ref 0.1–1.0)
NEUTROS ABS: 7.9 10*3/uL — AB (ref 1.7–7.7)
NEUTROS PCT: 74 %
Platelets: 350 10*3/uL (ref 150–400)
RBC: 4.27 MIL/uL (ref 4.22–5.81)
RDW: 11.9 % (ref 11.5–15.5)
WBC: 10.7 10*3/uL — AB (ref 4.0–10.5)

## 2016-03-29 LAB — COMPREHENSIVE METABOLIC PANEL
ALT: 27 U/L (ref 17–63)
ANION GAP: 12 (ref 5–15)
AST: 27 U/L (ref 15–41)
Albumin: 3.9 g/dL (ref 3.5–5.0)
Alkaline Phosphatase: 97 U/L (ref 38–126)
BUN: 10 mg/dL (ref 6–20)
CHLORIDE: 84 mmol/L — AB (ref 101–111)
CO2: 20 mmol/L — AB (ref 22–32)
CREATININE: 0.94 mg/dL (ref 0.61–1.24)
Calcium: 8.9 mg/dL (ref 8.9–10.3)
Glucose, Bld: 96 mg/dL (ref 65–99)
Potassium: 4.4 mmol/L (ref 3.5–5.1)
SODIUM: 116 mmol/L — AB (ref 135–145)
Total Bilirubin: 0.9 mg/dL (ref 0.3–1.2)
Total Protein: 7 g/dL (ref 6.5–8.1)

## 2016-03-29 LAB — OSMOLALITY, URINE: OSMOLALITY UR: 300 mosm/kg (ref 300–900)

## 2016-03-29 LAB — TSH: TSH: 0.786 u[IU]/mL (ref 0.350–4.500)

## 2016-03-29 LAB — SODIUM, URINE, RANDOM: SODIUM UR: 58 mmol/L

## 2016-03-29 LAB — CORTISOL-AM, BLOOD: Cortisol - AM: 17.5 ug/dL (ref 6.7–22.6)

## 2016-03-29 LAB — OSMOLALITY: OSMOLALITY: 241 mosm/kg — AB (ref 275–295)

## 2016-03-29 LAB — MRSA PCR SCREENING: MRSA BY PCR: NEGATIVE

## 2016-03-29 MED ORDER — MAGNESIUM CITRATE PO SOLN
1.0000 | Freq: Once | ORAL | Status: DC | PRN
  Filled 2016-03-29: qty 296

## 2016-03-29 MED ORDER — TRAZODONE HCL 50 MG PO TABS: 25.0000 mg | ORAL_TABLET | Freq: Every evening | ORAL | Status: DC | PRN

## 2016-03-29 MED ORDER — SODIUM CHLORIDE 0.9% FLUSH
3.0000 mL | Freq: Two times a day (BID) | INTRAVENOUS | Status: DC
  Administered 2016-03-29 – 2016-04-03 (×11): 3 mL via INTRAVENOUS

## 2016-03-29 MED ORDER — HYDRALAZINE HCL 20 MG/ML IJ SOLN: 5.0000 mg | Freq: Three times a day (TID) | INTRAMUSCULAR | Status: DC | PRN

## 2016-03-29 MED ORDER — ACETAMINOPHEN 650 MG RE SUPP: 650.0000 mg | Freq: Four times a day (QID) | RECTAL | Status: DC | PRN

## 2016-03-29 MED ORDER — TRAMADOL-ACETAMINOPHEN 37.5-325 MG PO TABS: 1.0000 | ORAL_TABLET | Freq: Four times a day (QID) | ORAL | Status: DC | PRN

## 2016-03-29 MED ORDER — ONDANSETRON HCL 4 MG/2ML IJ SOLN: 4.0000 mg | Freq: Four times a day (QID) | INTRAMUSCULAR | Status: DC | PRN

## 2016-03-29 MED ORDER — BISACODYL 10 MG RE SUPP: 10.0000 mg | Freq: Every day | RECTAL | Status: DC | PRN

## 2016-03-29 MED ORDER — TAMSULOSIN HCL 0.4 MG PO CAPS
0.4000 mg | ORAL_CAPSULE | Freq: Every day | ORAL | Status: DC
  Administered 2016-03-30 – 2016-04-03 (×5): 0.4 mg via ORAL
  Filled 2016-03-29 (×6): qty 1

## 2016-03-29 MED ORDER — ENOXAPARIN SODIUM 40 MG/0.4ML ~~LOC~~ SOLN
40.0000 mg | SUBCUTANEOUS | Status: DC
  Administered 2016-03-29 – 2016-04-02 (×5): 40 mg via SUBCUTANEOUS
  Filled 2016-03-29 (×5): qty 0.4

## 2016-03-29 MED ORDER — SODIUM CHLORIDE 0.9 % IV SOLN
INTRAVENOUS | Status: DC
  Administered 2016-03-29: 09:00:00 via INTRAVENOUS

## 2016-03-29 MED ORDER — SENNOSIDES-DOCUSATE SODIUM 8.6-50 MG PO TABS: 1.0000 | ORAL_TABLET | Freq: Every evening | ORAL | Status: DC | PRN

## 2016-03-29 MED ORDER — HEPARIN SODIUM (PORCINE) 5000 UNIT/ML IJ SOLN: 5000.0000 [IU] | Freq: Three times a day (TID) | INTRAMUSCULAR | Status: DC

## 2016-03-29 MED ORDER — SODIUM CHLORIDE 1 G PO TABS
1.0000 g | ORAL_TABLET | Freq: Three times a day (TID) | ORAL | Status: DC
  Administered 2016-03-29 – 2016-04-02 (×12): 1 g via ORAL
  Filled 2016-03-29 (×15): qty 1

## 2016-03-29 MED ORDER — ONDANSETRON HCL 4 MG PO TABS: 4.0000 mg | ORAL_TABLET | Freq: Four times a day (QID) | ORAL | Status: DC | PRN

## 2016-03-29 MED ORDER — ASPIRIN EC 81 MG PO TBEC
81.0000 mg | DELAYED_RELEASE_TABLET | Freq: Every day | ORAL | Status: DC
  Administered 2016-03-29 – 2016-04-03 (×6): 81 mg via ORAL
  Filled 2016-03-29 (×6): qty 1

## 2016-03-29 MED ORDER — ACETAMINOPHEN 325 MG PO TABS: 650.0000 mg | ORAL_TABLET | Freq: Four times a day (QID) | ORAL | Status: DC | PRN

## 2016-03-29 MED ORDER — CYCLOBENZAPRINE HCL 10 MG PO TABS
5.0000 mg | ORAL_TABLET | Freq: Once | ORAL | Status: AC
  Administered 2016-03-29: 5 mg via ORAL
  Filled 2016-03-29: qty 1

## 2016-03-29 NOTE — Progress Notes (Signed)
PA Wertman notified; patient c/o severe cramping in legs. Fluids paused due to loss of IV access. New order received for flexiril.
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
## 2016-03-29 NOTE — Progress Notes (Signed)
CRITICAL VALUE ALERT  Critical value received:  Sodium 116 & Serum Osmolarity 241  Date of notification:  03/29/16  Time of notification:  0916  Critical value read back:Yes.    Nurse who received alert:  Ronette Deter  MD notified (1st page):  [place]   Time of first page:  [phone]  MD notified (2nd page):  Time of second page:  Responding MD:  Shawn Route  Time MD responded:  [phone]

## 2016-03-29 NOTE — Progress Notes (Signed)
ANTICOAGULATION CONSULT NOTE - Follow Up Consult  Pharmacy Consult for Lovenox Indication: VTE prophylaxis  No Known Allergies  Patient Measurements: Height: 5\' 8"  (172.7 cm) Weight: 248 lb 8 oz (112.7 kg) IBW/kg (Calculated) : 68.4  Vital Signs: Temp: 98.7 F (37.1 C) (11/03 0757) Temp Source: Oral (11/03 0757) BP: 157/94 (11/03 0757)  Labs:  Recent Labs  03/29/16 0739  HGB 12.8*  HCT 34.6*  PLT 350  CREATININE 0.94    Estimated Creatinine Clearance: 99.2 mL/min (by C-G formula based on SCr of 0.94 mg/dL).   Medications:  SQ Heparin - this morning's dose not yet given  Assessment: 60 year old male with bladder cancer admitted for hyponatremia likely due to [facility]. He is to change VTE prophylaxis agents from SQ Heparin to Lovenox. His renal function is adequate for a standard lovenox dose.  Goal of Therapy:  Monitor platelets by anticoagulation protocol: Yes   Plan:  D/c SQ Heparin Lovenox 40mg  SQ q24h Check CBC q72h while on Lovenox Weekly serum creatinine monitoring As no dosing adjustments are anticipated pharmacy will sign off. Thank you for the consult.  Legrand Como, Pharm.D., BCPS, AAHIVP Clinical Pharmacist Phone: [phone] or [phone] Pager: [phone] 03/29/2016, 12:35 PM

## 2016-03-29 NOTE — Progress Notes (Signed)
Per [place], [place] - ok to switch to Lovenox since GFR greater than 60.

## 2016-03-29 NOTE — Care Management Note (Signed)
Case Management Note  Patient Details  Name: CHRISTPOHER WENGER MRN: [id number] Date of Birth: 03/29/1956  Subjective/Objective:    Pt admitted with bladder cancer and hyponatremia - pt also has diagnosis of prostate CA                Action/Plan:  PTA from home with wife.  CM will continue to follow for discharge needs   Expected Discharge Date:                  Expected Discharge Plan:  Home/Self Care  In-House Referral:     Discharge planning Services  CM Consult  Post Acute Care Choice:    Choice offered to:     DME Arranged:    DME Agency:     HH Arranged:    HH Agency:     Status of Service:  In process, will continue to follow  If discussed at Long Length of Stay Meetings, dates discussed:    Additional Comments:  Maryclare Labrador, RN 03/29/2016, 2:14 PM

## 2016-03-29 NOTE — Plan of Care (Signed)
Problem: Pain Managment: Goal: General experience of comfort will improve Outcome: Progressing Patient given new rx of flexeril for cramping in legs

## 2016-03-29 NOTE — Progress Notes (Signed)
CRITICAL VALUE ALERT  Critical value received:  Sodium 115  Date of notification:  03/29/16  Time of notification:  [id number]  Critical value read back:Yes.    Nurse who received alert:  Ronette Deter  MD notified (1st page):  Daleen Bo - textpage via Amion   Time of first page:  [phone]  MD notified (2nd page):  Time of second page:  Responding MD:    Time MD responded:

## 2016-03-29 NOTE — Progress Notes (Addendum)
MD notified; patient self catheterizes at home, need order to continue that while patient hospitalized. Cannot gather urine samples until that time. Will continue to monitor patient closely.  New order received for in/out cath.

## 2016-03-30 DIAGNOSIS — R918 Other nonspecific abnormal finding of lung field: Secondary | ICD-10-CM | POA: Diagnosis present

## 2016-03-30 DIAGNOSIS — N179 Acute kidney failure, unspecified: Secondary | ICD-10-CM | POA: Diagnosis present

## 2016-03-30 DIAGNOSIS — Z79899 Other long term (current) drug therapy: Secondary | ICD-10-CM | POA: Diagnosis not present

## 2016-03-30 DIAGNOSIS — Z7982 Long term (current) use of aspirin: Secondary | ICD-10-CM | POA: Diagnosis not present

## 2016-03-30 DIAGNOSIS — C678 Malignant neoplasm of overlapping sites of bladder: Secondary | ICD-10-CM | POA: Diagnosis present

## 2016-03-30 DIAGNOSIS — I10 Essential (primary) hypertension: Secondary | ICD-10-CM | POA: Diagnosis present

## 2016-03-30 DIAGNOSIS — C670 Malignant neoplasm of trigone of bladder: Secondary | ICD-10-CM | POA: Diagnosis not present

## 2016-03-30 DIAGNOSIS — Z8701 Personal history of pneumonia (recurrent): Secondary | ICD-10-CM | POA: Diagnosis not present

## 2016-03-30 DIAGNOSIS — H9192 Unspecified hearing loss, left ear: Secondary | ICD-10-CM | POA: Diagnosis present

## 2016-03-30 DIAGNOSIS — I119 Hypertensive heart disease without heart failure: Secondary | ICD-10-CM | POA: Diagnosis present

## 2016-03-30 DIAGNOSIS — D72829 Elevated white blood cell count, unspecified: Secondary | ICD-10-CM | POA: Diagnosis present

## 2016-03-30 DIAGNOSIS — E222 Syndrome of inappropriate secretion of antidiuretic hormone: Secondary | ICD-10-CM | POA: Diagnosis present

## 2016-03-30 DIAGNOSIS — Z9889 Other specified postprocedural states: Secondary | ICD-10-CM | POA: Diagnosis not present

## 2016-03-30 DIAGNOSIS — E871 Hypo-osmolality and hyponatremia: Secondary | ICD-10-CM | POA: Diagnosis not present

## 2016-03-30 LAB — BASIC METABOLIC PANEL
Anion gap: 8 (ref 5–15)
Anion gap: 11 (ref 5–15)
Anion gap: 12 (ref 5–15)
BUN: 10 mg/dL (ref 6–20)
BUN: 10 mg/dL (ref 6–20)
BUN: 11 mg/dL (ref 6–20)
CALCIUM: 8.6 mg/dL — AB (ref 8.9–10.3)
CHLORIDE: 86 mmol/L — AB (ref 101–111)
CHLORIDE: 83 mmol/L — AB (ref 101–111)
CO2: 22 mmol/L (ref 22–32)
CO2: 21 mmol/L — ABNORMAL LOW (ref 22–32)
CO2: 21 mmol/L — ABNORMAL LOW (ref 22–32)
CREATININE: 0.97 mg/dL (ref 0.61–1.24)
CREATININE: 1.11 mg/dL (ref 0.61–1.24)
CREATININE: 1.19 mg/dL (ref 0.61–1.24)
Calcium: 8.8 mg/dL — ABNORMAL LOW (ref 8.9–10.3)
Calcium: 8.7 mg/dL — ABNORMAL LOW (ref 8.9–10.3)
Chloride: 86 mmol/L — ABNORMAL LOW (ref 101–111)
GFR calc Af Amer: >60 mL/min (ref >60)
GFR calc Af Amer: >60 mL/min (ref >60)
GFR calc Af Amer: >60 mL/min (ref >60)
GFR calc non Af Amer: >60 mL/min (ref >60)
GLUCOSE: 128 mg/dL — AB (ref 65–99)
GLUCOSE: 100 mg/dL — AB (ref 65–99)
Glucose, Bld: 84 mg/dL (ref 65–99)
POTASSIUM: 3.5 mmol/L (ref 3.5–5.1)
POTASSIUM: 3.6 mmol/L (ref 3.5–5.1)
Potassium: 3.9 mmol/L (ref 3.5–5.1)
SODIUM: 118 mmol/L — AB (ref 135–145)
Sodium: 116 mmol/L — CL (ref 135–145)
Sodium: 116 mmol/L — CL (ref 135–145)

## 2016-03-30 LAB — URINE CULTURE: Culture: <10000 — AB

## 2016-03-30 LAB — CBC
HEMATOCRIT: 33.2 % — AB (ref 39.0–52.0)
HEMOGLOBIN: 12.2 g/dL — AB (ref 13.0–17.0)
MCH: 29.5 pg (ref 26.0–34.0)
MCHC: 36.7 g/dL — AB (ref 30.0–36.0)
MCV: 80.4 fL (ref 78.0–100.0)
Platelets: 326 10*3/uL (ref 150–400)
RBC: 4.13 MIL/uL — ABNORMAL LOW (ref 4.22–5.81)
RDW: 11.9 % (ref 11.5–15.5)
WBC: 8.9 10*3/uL (ref 4.0–10.5)

## 2016-03-30 LAB — COMPREHENSIVE METABOLIC PANEL
ALBUMIN: 3.6 g/dL (ref 3.5–5.0)
ALK PHOS: 89 U/L (ref 38–126)
ALT: 27 U/L (ref 17–63)
ANION GAP: 10 (ref 5–15)
AST: 25 U/L (ref 15–41)
BILIRUBIN TOTAL: 0.7 mg/dL (ref 0.3–1.2)
BUN: 10 mg/dL (ref 6–20)
CALCIUM: 8.4 mg/dL — AB (ref 8.9–10.3)
CO2: 21 mmol/L — AB (ref 22–32)
Chloride: 86 mmol/L — ABNORMAL LOW (ref 101–111)
Creatinine, Ser: 1.04 mg/dL (ref 0.61–1.24)
GFR calc Af Amer: >60 mL/min (ref >60)
GFR calc non Af Amer: >60 mL/min (ref >60)
GLUCOSE: 94 mg/dL (ref 65–99)
Potassium: 3.9 mmol/L (ref 3.5–5.1)
SODIUM: 117 mmol/L — AB (ref 135–145)
Total Protein: 6.6 g/dL (ref 6.5–8.1)

## 2016-03-30 MED ORDER — POTASSIUM CHLORIDE CRYS ER 20 MEQ PO TBCR
40.0000 meq | EXTENDED_RELEASE_TABLET | Freq: Once | ORAL | Status: AC
Start: 2016-03-30 — End: 2016-03-30
  Administered 2016-03-30: 40 meq via ORAL
  Filled 2016-03-30: qty 2

## 2016-03-30 MED ORDER — FUROSEMIDE 10 MG/ML IJ SOLN
40.0000 mg | Freq: Once | INTRAMUSCULAR | Status: AC
  Administered 2016-03-31: 40 mg via INTRAVENOUS
  Filled 2016-03-30: qty 4

## 2016-03-30 MED ORDER — POTASSIUM CHLORIDE CRYS ER 20 MEQ PO TBCR
20.0000 meq | EXTENDED_RELEASE_TABLET | Freq: Once | ORAL | Status: AC
  Administered 2016-03-31: 20 meq via ORAL
  Filled 2016-03-30: qty 1

## 2016-03-30 MED ORDER — FUROSEMIDE 10 MG/ML IJ SOLN
40.0000 mg | Freq: Once | INTRAMUSCULAR | Status: AC
  Administered 2016-03-30: 40 mg via INTRAVENOUS
  Filled 2016-03-30: qty 4

## 2016-03-30 MED ORDER — AMLODIPINE BESYLATE 5 MG PO TABS
5.0000 mg | ORAL_TABLET | Freq: Every day | ORAL | Status: DC
  Administered 2016-03-30 – 2016-03-31 (×2): 5 mg via ORAL
  Filled 2016-03-30 (×2): qty 1

## 2016-03-30 NOTE — Plan of Care (Signed)
Problem: Pain Managment: Goal: General experience of comfort will improve Outcome: Progressing Patient no longer complaining of muscle spasms or cramps on my shift, as sodium levels are starting to improve.   Problem: Tissue Perfusion: Goal: Risk factors for ineffective tissue perfusion will decrease Outcome: Progressing Patient receiving lovenox for VTE prophylaxis.   Problem: Activity: Goal: Risk for activity intolerance will decrease Outcome: Progressing Patient has bathroom privileges. Steady on his feet.   Problem: Fluid Volume: Goal: Ability to maintain a balanced intake and output will improve Outcome: Progressing Patient requiring baseline In and Out catheterization. Fluids being restricted.

## 2016-03-30 NOTE — Progress Notes (Signed)
CRITICAL VALUE ALERT  Critical value received:  NA 116  Date of notification:  03/30/16 Time of notification:  23:40   Critical value read back:Yes.    Nurse who received alert:  Delcie Roch, RN  MD notified (1st page):  [facility]  Time of first page:  23:43  MD notified (2nd page):  Time of second page:  Responding MD: Dr Allyson Sabal  Time MD responded:  23:47

## 2016-03-30 NOTE — Progress Notes (Signed)
PROGRESS NOTE        PATIENT DETAILS Name: Jaime Singh Age: 60 y.o. Sex: male Date of Birth: 1955-05-31 Admit Date: 03/29/2016 Admitting Physician Kinnie Feil, MD [id number], Gwyndolyn Saxon, MD  Brief Narrative: Patient is a 60 y.o. male with history of metastatic bladder cancer-on immunotherapy (last dose 2 weeks back) transfer from outside hospital for evaluation and management of hyponatremia. No history of nausea, vomiting or diarrhea. No evidence of volume overload on exam. Not on any offending medications.  Subjective: Denies headache, nausea, vomiting or diarrhea  No chest pain or shortness of breath.  Assessment/Plan: Active Problems: Hyponatremia: He is euvolemic, high suspicion for SIADH. Stop all IV fluids, place on fluid restriction, start intravenous Lasix. Follow sodium.  Metastatic bladder cancer: On immunotherapy-[facility]  Hypertension:Amlodipine-follow BP trend  DVT Prophylaxis: Prophylactic Lovenox  Code Status: Full code   Family Communication: None at bedside  Disposition Plan: Remain inpatient-transfer to Telemetry  Antimicrobial agents: None  Procedures: None  CONSULTS:  None  Time spent: 25 minutes-Greater than 50% of this time was spent in counseling, explanation of diagnosis, planning of further management, and coordination of care.  MEDICATIONS: Anti-infectives    None      Scheduled Meds: . aspirin EC  81 mg Oral Daily  . enoxaparin (LOVENOX) injection  40 mg Subcutaneous Q24H  . sodium chloride flush  3 mL Intravenous Q12H  . sodium chloride  1 g Oral TID WC  . tamsulosin  0.4 mg Oral Daily   Continuous Infusions:  PRN Meds:.acetaminophen **OR** acetaminophen, bisacodyl, hydrALAZINE, magnesium citrate, ondansetron **OR** ondansetron (ZOFRAN) IV, senna-docusate, traZODone   PHYSICAL EXAM: Vital signs: Vitals:   03/30/16 0500 03/30/16 0530 03/30/16 0711  03/30/16 1139  BP:   (!) 158/95   Pulse: 73     Resp: (!) 25     Temp:  97.8 F (36.6 C) 98.6 F (37 C) 98.3 F (36.8 C)  TempSrc:  Oral Oral Oral  SpO2: 90%     Weight:      Height:       Filed Weights   03/29/16 0626  Weight: 112.7 kg (248 lb 8 oz)   Body mass index is 37.78 kg/m.   General appearance :Awake, alert, not in any distress. Speech Clear. Not toxic Looking Eyes:, pupils equally reactive to light and accomodation,no scleral icterus.Pink conjunctiva HEENT: Atraumatic and Normocephalic Neck: supple, no JVD. No cervical lymphadenopathy. No thyromegaly Resp:Good air entry bilaterally, no added sounds  CVS: S1 S2 regular, no murmurs.  GI: Bowel sounds present, Non tender and not distended with no gaurding, rigidity or rebound.No organomegaly Extremities: B/L Lower Ext shows no edema, both legs are warm to touch Neurology:  speech clear,Non focal, sensation is grossly intact. Psychiatric: Normal judgment and insight. Alert and oriented x 3. Normal mood. Musculoskeletal:.No digital cyanosis Skin:No Rash, warm and dry Wounds:N/A  I have personally reviewed following labs and imaging studies  LABORATORY DATA: CBC:  Recent Labs Lab 03/29/16 0739 03/30/16 0107  WBC 10.7* 8.9  NEUTROABS 7.9*  --   HGB 12.8* 12.2*  HCT 34.6* 33.2*  MCV 81.0 80.4  PLT 350 [id number]    Basic Metabolic Panel:  Recent Labs Lab 03/29/16 0739 03/29/16 1312 03/29/16 1908 03/30/16 0107 03/30/16 0825  NA 116* 115* 116* 117* 116*  K 4.4 4.3 4.0 3.9  3.9  CL 84* 83* 85* 86* 86*  CO2 20* 23 22 21* 22  GLUCOSE 96 106* 98 94 84  BUN 10 9 9 10 10   CREATININE 0.94 1.00 0.96 1.04 0.97  CALCIUM 8.9 8.9 8.9 8.4* 8.6*    GFR: Estimated Creatinine Clearance: 96.2 mL/min (by C-G formula based on SCr of 0.97 mg/dL).  Liver Function Tests:  Recent Labs Lab 03/29/16 0739 03/30/16 0107  AST 27 25  ALT 27 27  ALKPHOS 97 89  BILITOT 0.9 0.7  PROT 7.0 6.6  ALBUMIN 3.9 3.6   No  results for input(s): LIPASE, AMYLASE in the last 168 hours. No results for input(s): AMMONIA in the last 168 hours.  Coagulation Profile: No results for input(s): INR, PROTIME in the last 168 hours.  Cardiac Enzymes: No results for input(s): CKTOTAL, CKMB, CKMBINDEX, TROPONINI in the last 168 hours.  BNP (last 3 results) No results for input(s): PROBNP in the last 8760 hours.  HbA1C: No results for input(s): HGBA1C in the last 72 hours.  CBG: No results for input(s): GLUCAP in the last 168 hours.  Lipid Profile: No results for input(s): CHOL, HDL, LDLCALC, TRIG, CHOLHDL, LDLDIRECT in the last 72 hours.  Thyroid Function Tests:  Recent Labs  03/29/16 0739  TSH 0.786    Anemia Panel: No results for input(s): VITAMINB12, FOLATE, FERRITIN, TIBC, IRON, RETICCTPCT in the last 72 hours.  Urine analysis: No results found for: COLORURINE, APPEARANCEUR, LABSPEC, PHURINE, GLUCOSEU, HGBUR, BILIRUBINUR, KETONESUR, PROTEINUR, UROBILINOGEN, NITRITE, LEUKOCYTESUR  Sepsis Labs: Lactic Acid, Venous No results found for: LATICACIDVEN  MICROBIOLOGY: Recent Results (from the past 240 hour(s))  MRSA PCR Screening     Status: None   Collection Time: 03/29/16  6:56 AM  Result Value Ref Range Status   MRSA by PCR NEGATIVE NEGATIVE Final    Comment:        The GeneXpert MRSA Assay (FDA approved for NASAL specimens only), is one component of a comprehensive MRSA colonization surveillance program. It is not intended to diagnose MRSA infection nor to guide or monitor treatment for MRSA infections.   Urine culture     Status: Abnormal   Collection Time: 03/29/16  9:02 AM  Result Value Ref Range Status   Specimen Description URINE, CATHETERIZED  Final   Special Requests NONE  Final   Culture <10,000 COLONIES/mL INSIGNIFICANT GROWTH (A)  Final   Report Status 03/30/2016 FINAL  Final    RADIOLOGY STUDIES/RESULTS: Dg Chest 2 View  Result Date: 03/29/2016 CLINICAL DATA:  Hypo neck  tree knee up. History of pneumonia and bladder malignancy. EXAM: CHEST  2 VIEW COMPARISON:  Portable chest x-ray of March 28, 2016 FINDINGS: The lungs are slightly less well inflated today. The interstitial markings are coarse. There is no alveolar infiltrate. There is no pleural effusion. The cardiac silhouette is enlarged. The pulmonary vascularity is not engorged. The mediastinum is normal in width. The bony thorax exhibits no acute abnormality. IMPRESSION: Chronic bronchitic changes. Cardiomegaly without pulmonary edema. No evidence of pneumonia. No suspicious pulmonary masses are observed. Electronically Signed   By: David  Martinique M.D.   On: 03/29/2016 14:49     LOS: 1 day   Oren Binet, MD  [facility] Pager:[phone]  If 7PM-7AM, please contact night-coverage [email or web address] Password [email or web address] 03/30/2016, 1:33 PM

## 2016-03-31 LAB — BASIC METABOLIC PANEL
ANION GAP: 11 (ref 5–15)
ANION GAP: 10 (ref 5–15)
ANION GAP: 10 (ref 5–15)
BUN: 13 mg/dL (ref 6–20)
BUN: 16 mg/dL (ref 6–20)
BUN: 20 mg/dL (ref 6–20)
CHLORIDE: 87 mmol/L — AB (ref 101–111)
CHLORIDE: 89 mmol/L — AB (ref 101–111)
CHLORIDE: 89 mmol/L — AB (ref 101–111)
CO2: 22 mmol/L (ref 22–32)
CO2: 21 mmol/L — ABNORMAL LOW (ref 22–32)
CO2: 22 mmol/L (ref 22–32)
Calcium: 9.5 mg/dL (ref 8.9–10.3)
Calcium: 9.3 mg/dL (ref 8.9–10.3)
Calcium: 9.4 mg/dL (ref 8.9–10.3)
Creatinine, Ser: 1.37 mg/dL — ABNORMAL HIGH (ref 0.61–1.24)
Creatinine, Ser: 1.23 mg/dL (ref 0.61–1.24)
Creatinine, Ser: 1.52 mg/dL — ABNORMAL HIGH (ref 0.61–1.24)
GFR calc Af Amer: >60 mL/min (ref >60)
GFR, EST AFRICAN AMERICAN: 55 mL/min — AB (ref >60)
GFR, EST NON AFRICAN AMERICAN: 54 mL/min — AB (ref >60)
GFR, EST NON AFRICAN AMERICAN: 47 mL/min — AB (ref >60)
Glucose, Bld: 108 mg/dL — ABNORMAL HIGH (ref 65–99)
Glucose, Bld: 119 mg/dL — ABNORMAL HIGH (ref 65–99)
Glucose, Bld: 131 mg/dL — ABNORMAL HIGH (ref 65–99)
POTASSIUM: 4.3 mmol/L (ref 3.5–5.1)
POTASSIUM: 4.3 mmol/L (ref 3.5–5.1)
POTASSIUM: 4 mmol/L (ref 3.5–5.1)
SODIUM: 120 mmol/L — AB (ref 135–145)
SODIUM: 120 mmol/L — AB (ref 135–145)
SODIUM: 121 mmol/L — AB (ref 135–145)

## 2016-03-31 MED ORDER — FUROSEMIDE 10 MG/ML IJ SOLN
40.0000 mg | Freq: Once | INTRAMUSCULAR | Status: AC
  Administered 2016-03-31: 40 mg via INTRAVENOUS
  Filled 2016-03-31: qty 4

## 2016-03-31 MED ORDER — FUROSEMIDE 10 MG/ML IJ SOLN: 20.0000 mg | Freq: Once | INTRAMUSCULAR | Status: DC

## 2016-03-31 MED ORDER — CYCLOBENZAPRINE HCL 10 MG PO TABS
10.0000 mg | ORAL_TABLET | Freq: Every day | ORAL | Status: DC
  Administered 2016-03-31 – 2016-04-02 (×4): 10 mg via ORAL
  Filled 2016-03-31 (×4): qty 1

## 2016-03-31 NOTE — Progress Notes (Signed)
PROGRESS NOTE        PATIENT DETAILS Name: Jaime Singh Age: 60 y.o. Sex: male Date of Birth: May 13, 1956 Admit Date: 03/29/2016 Admitting Physician Kinnie Feil, MD [id number], Gwyndolyn Saxon, MD  Brief Narrative: Patient is a 60 y.o. male with history of metastatic bladder cancer-on immunotherapy (last dose 2 weeks back) transfer from outside hospital for evaluation and management of hyponatremia. No history of nausea, vomiting or diarrhea. No evidence of volume overload on exam. Not on any offending medications-except for Keytruda (immunotherapy) that apparently can cause hyponatremia.  Subjective: Denies headache, nausea, vomiting or diarrhea  No chest pain or shortness of breath.  Assessment/Plan: Active Problems: Hyponatremia:Slowly improving with Lasix/Fluid restriction-continues to bes euvolemic, high suspicion for SIADH. Mild AKI today-hold diuretics till next BMET-follow. He is essentially asymptomatic from Hyponatremia.  Metastatic bladder cancer: On immunotherapy-[facility].  Hypertension:Hold amlodipine-as will use Lasix for now  DVT Prophylaxis: Prophylactic Lovenox  Code Status: Full code   Family Communication: None at bedside  Disposition Plan: Remain inpatient-home in next 2-3 days  Antimicrobial agents: None  Procedures: None  CONSULTS:  None  Time spent: 25 minutes-Greater than 50% of this time was spent in counseling, explanation of diagnosis, planning of further management, and coordination of care.  MEDICATIONS: Anti-infectives    None      Scheduled Meds: . amLODipine  5 mg Oral Daily  . aspirin EC  81 mg Oral Daily  . cyclobenzaprine  10 mg Oral QHS  . enoxaparin (LOVENOX) injection  40 mg Subcutaneous Q24H  . sodium chloride flush  3 mL Intravenous Q12H  . sodium chloride  1 g Oral TID WC  . tamsulosin  0.4 mg Oral Daily   Continuous Infusions: PRN Meds:.acetaminophen  **OR** acetaminophen, bisacodyl, hydrALAZINE, magnesium citrate, ondansetron **OR** ondansetron (ZOFRAN) IV, senna-docusate, traZODone   PHYSICAL EXAM: Vital signs: Vitals:   03/30/16 2018 03/30/16 2133 03/31/16 0308 03/31/16 0631  BP: (!) 148/82 (!) 149/72  (!) 114/55  Pulse: 87 81  78  Resp: (!) 21 18  18   Temp: 98.8 F (37.1 C) 98.4 F (36.9 C)  97.9 F (36.6 C)  TempSrc: Oral Oral  Oral  SpO2: 96% 97%  92%  Weight:   111.1 kg (245 lb)   Height:       Filed Weights   03/29/16 0626 03/31/16 0308  Weight: 112.7 kg (248 lb 8 oz) 111.1 kg (245 lb)   Body mass index is 37.25 kg/m.   General appearance :Awake, alert, not in any distress. Speech Clear. Not toxic Looking Eyes:, pupils equally reactive to light and accomodation,no scleral icterus.Pink conjunctiva HEENT: Atraumatic and Normocephalic Neck: supple, no JVD. No cervical lymphadenopathy. No thyromegaly Resp:Good air entry bilaterally, no added sounds  CVS: S1 S2 regular, no murmurs.  GI: Bowel sounds present, Non tender and not distended with no gaurding, rigidity or rebound.No organomegaly Extremities: B/L Lower Ext shows no edema, both legs are warm to touch Neurology:  speech clear,Non focal, sensation is grossly intact. Psychiatric: Normal judgment and insight. Alert and oriented x 3. Normal mood. Musculoskeletal:.No digital cyanosis Skin:No Rash, warm and dry Wounds:N/A  I have personally reviewed following labs and imaging studies  LABORATORY DATA: CBC:  Recent Labs Lab 03/29/16 0739 03/30/16 0107  WBC 10.7* 8.9  NEUTROABS 7.9*  --   HGB 12.8* 12.2*  HCT 34.6* 33.2*  MCV 81.0 80.4  PLT 350 [id number]    Basic Metabolic Panel:  Recent Labs Lab 03/30/16 0107 03/30/16 0825 03/30/16 1541 03/30/16 2148 03/31/16 0503  NA 117* 116* 118* 116* 120*  K 3.9 3.9 3.5 3.6 4.3  CL 86* 86* 86* 83* 87*  CO2 21* 22 21* 21* 22  GLUCOSE 94 84 128* 100* 108*  BUN 10 10 10 11 13   CREATININE 1.04 0.97 1.11 1.19  1.37*  CALCIUM 8.4* 8.6* 8.8* 8.7* 9.5    GFR: Estimated Creatinine Clearance: 67.6 mL/min (by C-G formula based on SCr of 1.37 mg/dL (H)).  Liver Function Tests:  Recent Labs Lab 03/29/16 0739 03/30/16 0107  AST 27 25  ALT 27 27  ALKPHOS 97 89  BILITOT 0.9 0.7  PROT 7.0 6.6  ALBUMIN 3.9 3.6   No results for input(s): LIPASE, AMYLASE in the last 168 hours. No results for input(s): AMMONIA in the last 168 hours.  Coagulation Profile: No results for input(s): INR, PROTIME in the last 168 hours.  Cardiac Enzymes: No results for input(s): CKTOTAL, CKMB, CKMBINDEX, TROPONINI in the last 168 hours.  BNP (last 3 results) No results for input(s): PROBNP in the last 8760 hours.  HbA1C: No results for input(s): HGBA1C in the last 72 hours.  CBG: No results for input(s): GLUCAP in the last 168 hours.  Lipid Profile: No results for input(s): CHOL, HDL, LDLCALC, TRIG, CHOLHDL, LDLDIRECT in the last 72 hours.  Thyroid Function Tests:  Recent Labs  03/29/16 0739  TSH 0.786    Anemia Panel: No results for input(s): VITAMINB12, FOLATE, FERRITIN, TIBC, IRON, RETICCTPCT in the last 72 hours.  Urine analysis: No results found for: COLORURINE, APPEARANCEUR, LABSPEC, PHURINE, GLUCOSEU, HGBUR, BILIRUBINUR, KETONESUR, PROTEINUR, UROBILINOGEN, NITRITE, LEUKOCYTESUR  Sepsis Labs: Lactic Acid, Venous No results found for: LATICACIDVEN  MICROBIOLOGY: Recent Results (from the past 240 hour(s))  MRSA PCR Screening     Status: None   Collection Time: 03/29/16  6:56 AM  Result Value Ref Range Status   MRSA by PCR NEGATIVE NEGATIVE Final    Comment:        The GeneXpert MRSA Assay (FDA approved for NASAL specimens only), is one component of a comprehensive MRSA colonization surveillance program. It is not intended to diagnose MRSA infection nor to guide or monitor treatment for MRSA infections.   Urine culture     Status: Abnormal   Collection Time: 03/29/16  9:02 AM    Result Value Ref Range Status   Specimen Description URINE, CATHETERIZED  Final   Special Requests NONE  Final   Culture <10,000 COLONIES/mL INSIGNIFICANT GROWTH (A)  Final   Report Status 03/30/2016 FINAL  Final    RADIOLOGY STUDIES/RESULTS: Dg Chest 2 View  Result Date: 03/29/2016 CLINICAL DATA:  Hypo neck tree knee up. History of pneumonia and bladder malignancy. EXAM: CHEST  2 VIEW COMPARISON:  Portable chest x-ray of March 28, 2016 FINDINGS: The lungs are slightly less well inflated today. The interstitial markings are coarse. There is no alveolar infiltrate. There is no pleural effusion. The cardiac silhouette is enlarged. The pulmonary vascularity is not engorged. The mediastinum is normal in width. The bony thorax exhibits no acute abnormality. IMPRESSION: Chronic bronchitic changes. Cardiomegaly without pulmonary edema. No evidence of pneumonia. No suspicious pulmonary masses are observed. Electronically Signed   By: David  Martinique M.D.   On: 03/29/2016 14:49     LOS: 2 days   Oren Binet, MD  [facility] Pager:[phone]  [phone]  If 7PM-7AM, please contact night-coverage [email or web address] Password [email or web address] 03/31/2016, 12:58 PM

## 2016-04-01 DIAGNOSIS — C678 Malignant neoplasm of overlapping sites of bladder: Secondary | ICD-10-CM

## 2016-04-01 LAB — BASIC METABOLIC PANEL
Anion gap: 12 (ref 5–15)
Anion gap: 11 (ref 5–15)
BUN: 20 mg/dL (ref 6–20)
BUN: 22 mg/dL — AB (ref 6–20)
CALCIUM: 9.5 mg/dL (ref 8.9–10.3)
CALCIUM: 9.5 mg/dL (ref 8.9–10.3)
CO2: 20 mmol/L — AB (ref 22–32)
CO2: 23 mmol/L (ref 22–32)
CREATININE: 1.43 mg/dL — AB (ref 0.61–1.24)
CREATININE: 1.46 mg/dL — AB (ref 0.61–1.24)
Chloride: 90 mmol/L — ABNORMAL LOW (ref 101–111)
Chloride: 91 mmol/L — ABNORMAL LOW (ref 101–111)
GFR calc non Af Amer: 51 mL/min — ABNORMAL LOW (ref >60)
GFR calc non Af Amer: 50 mL/min — ABNORMAL LOW (ref >60)
GFR, EST AFRICAN AMERICAN: 59 mL/min — AB (ref >60)
GFR, EST AFRICAN AMERICAN: 58 mL/min — AB (ref >60)
Glucose, Bld: 106 mg/dL — ABNORMAL HIGH (ref 65–99)
Glucose, Bld: 127 mg/dL — ABNORMAL HIGH (ref 65–99)
Potassium: 4.1 mmol/L (ref 3.5–5.1)
Potassium: 4.4 mmol/L (ref 3.5–5.1)
SODIUM: 122 mmol/L — AB (ref 135–145)
Sodium: 125 mmol/L — ABNORMAL LOW (ref 135–145)

## 2016-04-01 NOTE — Progress Notes (Signed)
Schorr, NP paged about CMP results at [place].

## 2016-04-01 NOTE — Progress Notes (Signed)
PROGRESS NOTE        PATIENT DETAILS Name: Jaime Singh Age: 60 y.o. Sex: male Date of Birth: 02/15/1956 Admit Date: 03/29/2016 Admitting Physician Kinnie Feil, MD [id number], Gwyndolyn Saxon, MD  Brief Narrative: Patient is a 60 y.o. male with history of metastatic bladder cancer-on immunotherapy (last dose 2 weeks back) transfer from outside hospital for evaluation and management of hyponatremia. No history of nausea, vomiting or diarrhea. No evidence of volume overload on exam. Not on any offending medications-except for Keytruda (immunotherapy) that apparently can cause hyponatremia.Patient normally drinks around 1 gallon of fluid daily  Subjective: Patient is doing well, asymptomatic at this time. Continues to follow water restrictions.   Assessment/Plan: Hyponatremia:Slowly improving with Lasix/Fluid restriction-continues to be euvolemic, high suspicion for SIADH. Mild AKI today-hold diuretics till next BMET-follow. He is essentially asymptomatic from Hyponatremia.  [id number] 2/2 to Lasix-fluid restriction-plans are to follow closely-may need to tolerate some amount of AKI-suspect that labs will equilibrate and creatinine will improve with time.   Metastatic bladder cancer: On immunotherapy-followed by [facility]. Will contact his oncologist regarding medication adjustments.   Hypertension:Hold amlodipine and lasix. BP currently controlled.  DVT Prophylaxis: Prophylactic Lovenox   Code Status: Full code  Family Communication: None at bedside  Disposition Plan: Remain inpatient- home in next 2-3 days-when Na in the high 120's range  Antimicrobial agents: None  Procedures: None  CONSULTS:  None  Time spent: 25 minutes-Greater than 50% of this time was spent in counseling, explanation of diagnosis, planning of further management, and coordination of care.  MEDICATIONS: Anti-infectives    None      Scheduled  Meds: . aspirin EC  81 mg Oral Daily  . cyclobenzaprine  10 mg Oral QHS  . enoxaparin (LOVENOX) injection  40 mg Subcutaneous Q24H  . sodium chloride flush  3 mL Intravenous Q12H  . sodium chloride  1 g Oral TID WC  . tamsulosin  0.4 mg Oral Daily   Continuous Infusions: PRN Meds:.acetaminophen **OR** acetaminophen, bisacodyl, hydrALAZINE, magnesium citrate, ondansetron **OR** ondansetron (ZOFRAN) IV, senna-docusate, traZODone   PHYSICAL EXAM: Vital signs: Vitals:   03/31/16 1542 03/31/16 2047 03/31/16 2145 04/01/16 0512  BP: 122/74 120/71 120/71   Pulse: 90 96 96   Resp: 19 18 18    Temp: 98.3 F (36.8 C) 98.3 F (36.8 C) 98.3 F (36.8 C)   TempSrc: Oral Oral Oral   SpO2: 96% 96% 95%   Weight:    109.7 kg (241 lb 14.6 oz)  Height:       Filed Weights   03/29/16 0626 03/31/16 0308 04/01/16 0512  Weight: 112.7 kg (248 lb 8 oz) 111.1 kg (245 lb) 109.7 kg (241 lb 14.6 oz)   Body mass index is 36.78 kg/m.   General appearance :Awake, alert, not in any distress. Speech Clear. Not toxic Looking Eyes:,no scleral icterus.Pink conjunctiva HEENT: Atraumatic and Normocephalic Resp:Good air entry bilaterally, no added sounds  CVS: S1 S2 regular, no murmurs.  GI: Non tender and not distended with no gaurding, rigidity or rebound.No organomegaly Extremities: B/L Lower Ext shows no edema, both legs are warm to touch Neurology:  speech clear,Non focal, sensation is grossly intact. Psychiatric: Normal judgment and insight. Alert and oriented x 3. Normal mood. Skin:No Rash, warm and dry  I have personally reviewed following labs and imaging studies  LABORATORY DATA:  CBC:  Recent Labs Lab 03/29/16 0739 03/30/16 0107  WBC 10.7* 8.9  NEUTROABS 7.9*  --   HGB 12.8* 12.2*  HCT 34.6* 33.2*  MCV 81.0 80.4  PLT 350 [id number]    Basic Metabolic Panel:  Recent Labs Lab 03/30/16 2148 03/31/16 0503 03/31/16 1259 03/31/16 2116 04/01/16 0624  NA 116* 120* 120* 121* 122*  K 3.6 4.3  4.3 4.0 4.1  CL 83* 87* 89* 89* 90*  CO2 21* 22 21* 22 20*  GLUCOSE 100* 108* 119* 131* 106*  BUN 11 13 16 20 20   CREATININE 1.19 1.37* 1.23 1.52* 1.43*  CALCIUM 8.7* 9.5 9.3 9.4 9.5    GFR: Estimated Creatinine Clearance: 64.3 mL/min (by C-G formula based on SCr of 1.43 mg/dL (H)).  Liver Function Tests:  Recent Labs Lab 03/29/16 0739 03/30/16 0107  AST 27 25  ALT 27 27  ALKPHOS 97 89  BILITOT 0.9 0.7  PROT 7.0 6.6  ALBUMIN 3.9 3.6   No results for input(s): LIPASE, AMYLASE in the last 168 hours. No results for input(s): AMMONIA in the last 168 hours.  Coagulation Profile: No results for input(s): INR, PROTIME in the last 168 hours.  Cardiac Enzymes: No results for input(s): CKTOTAL, CKMB, CKMBINDEX, TROPONINI in the last 168 hours.  BNP (last 3 results) No results for input(s): PROBNP in the last 8760 hours.  HbA1C: No results for input(s): HGBA1C in the last 72 hours.  CBG: No results for input(s): GLUCAP in the last 168 hours.  Lipid Profile: No results for input(s): CHOL, HDL, LDLCALC, TRIG, CHOLHDL, LDLDIRECT in the last 72 hours.  Thyroid Function Tests: No results for input(s): TSH, T4TOTAL, FREET4, T3FREE, THYROIDAB in the last 72 hours.  Anemia Panel: No results for input(s): VITAMINB12, FOLATE, FERRITIN, TIBC, IRON, RETICCTPCT in the last 72 hours.  Urine analysis: No results found for: COLORURINE, APPEARANCEUR, LABSPEC, PHURINE, GLUCOSEU, HGBUR, BILIRUBINUR, KETONESUR, PROTEINUR, UROBILINOGEN, NITRITE, LEUKOCYTESUR  Sepsis Labs: Lactic Acid, Venous No results found for: LATICACIDVEN  MICROBIOLOGY: Recent Results (from the past 240 hour(s))  MRSA PCR Screening     Status: None   Collection Time: 03/29/16  6:56 AM  Result Value Ref Range Status   MRSA by PCR NEGATIVE NEGATIVE Final    Comment:        The GeneXpert MRSA Assay (FDA approved for NASAL specimens only), is one component of a comprehensive MRSA colonization surveillance  program. It is not intended to diagnose MRSA infection nor to guide or monitor treatment for MRSA infections.   Urine culture     Status: Abnormal   Collection Time: 03/29/16  9:02 AM  Result Value Ref Range Status   Specimen Description URINE, CATHETERIZED  Final   Special Requests NONE  Final   Culture <10,000 COLONIES/mL INSIGNIFICANT GROWTH (A)  Final   Report Status 03/30/2016 FINAL  Final    RADIOLOGY STUDIES/RESULTS: Dg Chest 2 View  Result Date: 03/29/2016 CLINICAL DATA:  Hypo neck tree knee up. History of pneumonia and bladder malignancy. EXAM: CHEST  2 VIEW COMPARISON:  Portable chest x-ray of March 28, 2016 FINDINGS: The lungs are slightly less well inflated today. The interstitial markings are coarse. There is no alveolar infiltrate. There is no pleural effusion. The cardiac silhouette is enlarged. The pulmonary vascularity is not engorged. The mediastinum is normal in width. The bony thorax exhibits no acute abnormality. IMPRESSION: Chronic bronchitic changes. Cardiomegaly without pulmonary edema. No evidence of pneumonia. No suspicious pulmonary masses are observed. Electronically Signed  By: David  Martinique M.D.   On: 03/29/2016 14:49     LOS: 3 days   Loni Muse, PA-S  [facility] Pager:[phone]  If 7PM-7AM, please contact night-coverage [email or web address] Password [facility] 04/01/2016, 8:44 AM  Attending MD note  Patient was seen, examined,treatment plan was discussed with the PA-S.  I have personally reviewed the clinical findings, lab, imaging studies and management of this patient in detail. I agree with the documentation, as recorded by the PA-S.   Patient is doing well-Na continues to slowly increase  On Exam: Gen. 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

## 2016-04-01 NOTE — Care Management Note (Signed)
Case Management Note  Patient Details  Name: KORDEL ARRIOLA MRN: [id number] Date of Birth: 1955-08-11  Subjective/Objective:                 Patient from home with wife From [place], admitted with hyponatremia, mild AKI. Currently being treated for bladder CA at [facility] in [place]. Patient flies out of [place] for treatment. Sodium slowly improving, Lasix.   Action/Plan:  Anticipate DC to home 2-3 days when Na normalizes.  Expected Discharge Date:                  Expected Discharge Plan:  Home/Self Care  In-House Referral:     Discharge planning Services  CM Consult  Post Acute Care Choice:    Choice offered to:     DME Arranged:    DME Agency:     HH Arranged:    HH Agency:     Status of Service:  In process, will continue to follow  If discussed at Long Length of Stay Meetings, dates discussed:    Additional Comments:  Carles Collet, RN 04/01/2016, 3:51 PM

## 2016-04-02 LAB — CBC
HEMATOCRIT: 39.2 % (ref 39.0–52.0)
Hemoglobin: 14 g/dL (ref 13.0–17.0)
MCH: 30 pg (ref 26.0–34.0)
MCHC: 35.7 g/dL (ref 30.0–36.0)
MCV: 84.1 fL (ref 78.0–100.0)
Platelets: 330 10*3/uL (ref 150–400)
RBC: 4.66 MIL/uL (ref 4.22–5.81)
RDW: 12.6 % (ref 11.5–15.5)
WBC: 14.6 10*3/uL — AB (ref 4.0–10.5)

## 2016-04-02 LAB — BASIC METABOLIC PANEL
ANION GAP: 10 (ref 5–15)
BUN: 24 mg/dL — AB (ref 6–20)
CHLORIDE: 94 mmol/L — AB (ref 101–111)
CO2: 21 mmol/L — AB (ref 22–32)
Calcium: 9.5 mg/dL (ref 8.9–10.3)
Creatinine, Ser: 1.42 mg/dL — ABNORMAL HIGH (ref 0.61–1.24)
GFR calc Af Amer: 60 mL/min — ABNORMAL LOW (ref >60)
GFR calc non Af Amer: 51 mL/min — ABNORMAL LOW (ref >60)
GLUCOSE: 107 mg/dL — AB (ref 65–99)
POTASSIUM: 4.5 mmol/L (ref 3.5–5.1)
Sodium: 125 mmol/L — ABNORMAL LOW (ref 135–145)

## 2016-04-02 MED ORDER — SODIUM CHLORIDE 1 G PO TABS
1.0000 g | ORAL_TABLET | Freq: Two times a day (BID) | ORAL | Status: DC
  Administered 2016-04-02 – 2016-04-03 (×2): 1 g via ORAL
  Filled 2016-04-02 (×3): qty 1

## 2016-04-02 MED ORDER — FUROSEMIDE 20 MG PO TABS
20.0000 mg | ORAL_TABLET | Freq: Every day | ORAL | Status: DC
  Administered 2016-04-02 – 2016-04-03 (×2): 20 mg via ORAL
  Filled 2016-04-02 (×2): qty 1

## 2016-04-02 NOTE — Progress Notes (Signed)
PROGRESS NOTE        PATIENT DETAILS Name: Jaime Singh Age: 60 y.o. Sex: male Date of Birth: 03-01-1956 Admit Date: 03/29/2016 Admitting Physician Kinnie Feil, MD [id number], Gwyndolyn Saxon, MD  Brief Narrative: Patient is a 60 y.o. male with history of metastatic bladder cancer-on immunotherapy (last dose 2 weeks back) transferred from outside hospital for evaluation and management of hyponatremia. No history of nausea, vomiting or diarrhea. No evidence of volume overload on exam. Not on any offending medications-except for Keytruda (immunotherapy) that apparently can cause hyponatremia.Patient normally drinks around 1 gallon of fluid daily  Subjective:  Patient is doing well, asymptomatic at this time. No changes since yesterday.  Assessment/Plan: Hyponatremia:Slowly improving with Lasix/Fluid restriction-continues to be euvolemic, high suspicion for SIADH. Continues to have mild AKI-and sodium seems to have somewhat plateaued-we will go ahead and start oral Lasix today, suspect that if sodium continues to improve and is in the high 120s range-he should be able to go home on 11/8. He already has an appointment with his primary oncologist in [place] he can get a repeat chemistry panel. Note is essentially asymptomatic from Hyponatremia.  [id number] 2/2 to Lasix-fluid restriction-plans are to follow closely-may need to tolerate some amount of AKI-suspect that labs will equilibrate and creatinine will improve with time.   Leukocytosis: No indication of infection, follow at this time.  Metastatic bladder cancer:On immunotherapy-followed by [facility]. Will contact his oncologist regarding medication adjustments.   Hypertension:Hold amlodipine and lasix. BP currently controlled.  DVT Prophylaxis: Prophylactic Lovenox   Code Status: Full code  Family Communication: None at bedside  Disposition Plan: Remain inpatient-home  likely on 11/8 if Na in the high 120's range  Antimicrobial agents: None  Procedures: None  CONSULTS:  None  Time spent: 25 minutes-Greater than 50% of this time was spent in counseling, explanation of diagnosis, planning of further management, and coordination of care.  MEDICATIONS: Anti-infectives    None      Scheduled Meds: . aspirin EC  81 mg Oral Daily  . cyclobenzaprine  10 mg Oral QHS  . enoxaparin (LOVENOX) injection  40 mg Subcutaneous Q24H  . sodium chloride flush  3 mL Intravenous Q12H  . sodium chloride  1 g Oral TID WC  . tamsulosin  0.4 mg Oral Daily   Continuous Infusions: PRN Meds:.acetaminophen **OR** acetaminophen, bisacodyl, hydrALAZINE, magnesium citrate, ondansetron **OR** ondansetron (ZOFRAN) IV, senna-docusate, traZODone   PHYSICAL EXAM: Vital signs: Vitals:   04/01/16 1403 04/01/16 2220 04/02/16 0554 04/02/16 0558  BP: 129/71 123/65 130/71   Pulse: 99 (!) 102 92   Resp: 20 16 20    Temp: 99.4 F (37.4 C) 99.4 F (37.4 C) 98 F (36.7 C)   TempSrc: Oral Oral Oral   SpO2: 91% 98% 97%   Weight:    104.9 kg (231 lb 4.2 oz)  Height:       Filed Weights   03/31/16 0308 04/01/16 0512 04/02/16 0558  Weight: 111.1 kg (245 lb) 109.7 kg (241 lb 14.6 oz) 104.9 kg (231 lb 4.2 oz)   Body mass index is 35.16 kg/m.   General appearance :Awake, alert, not in any distress. Speech Clear. Not toxic Looking Eyes:, no scleral icterus.Pink conjunctiva HEENT: Atraumatic and Normocephalic Resp:Good air entry bilaterally, no added sounds  CVS: S1 S2 regular, no murmurs.  GI: Non tender and  not distended with no gaurding, rigidity or rebound.No organomegaly Extremities: B/L Lower Ext shows no edema, both legs are warm to touch Neurology:  speech clear,Non focal Psychiatric: Normal judgment and insight. Alert and oriented x 3. Normal mood. Skin:No Rash, warm and dry Wounds:N/A  I have personally reviewed following labs and imaging studies  LABORATORY  DATA: CBC:  Recent Labs Lab 03/29/16 0739 03/30/16 0107 04/02/16 0522  WBC 10.7* 8.9 14.6*  NEUTROABS 7.9*  --   --   HGB 12.8* 12.2* 14.0  HCT 34.6* 33.2* 39.2  MCV 81.0 80.4 84.1  PLT 350 326 [id number]    Basic Metabolic Panel:  Recent Labs Lab 03/31/16 1259 03/31/16 2116 04/01/16 0624 04/01/16 1533 04/02/16 0522  NA 120* 121* 122* 125* 125*  K 4.3 4.0 4.1 4.4 4.5  CL 89* 89* 90* 91* 94*  CO2 21* 22 20* 23 21*  GLUCOSE 119* 131* 106* 127* 107*  BUN 16 20 20  22* 24*  CREATININE 1.23 1.52* 1.43* 1.46* 1.42*  CALCIUM 9.3 9.4 9.5 9.5 9.5    GFR: Estimated Creatinine Clearance: 63.3 mL/min (by C-G formula based on SCr of 1.42 mg/dL (H)).  Liver Function Tests:  Recent Labs Lab 03/29/16 0739 03/30/16 0107  AST 27 25  ALT 27 27  ALKPHOS 97 89  BILITOT 0.9 0.7  PROT 7.0 6.6  ALBUMIN 3.9 3.6   No results for input(s): LIPASE, AMYLASE in the last 168 hours. No results for input(s): AMMONIA in the last 168 hours.  Coagulation Profile: No results for input(s): INR, PROTIME in the last 168 hours.  Cardiac Enzymes: No results for input(s): CKTOTAL, CKMB, CKMBINDEX, TROPONINI in the last 168 hours.  BNP (last 3 results) No results for input(s): PROBNP in the last 8760 hours.  HbA1C: No results for input(s): HGBA1C in the last 72 hours.  CBG: No results for input(s): GLUCAP in the last 168 hours.  Lipid Profile: No results for input(s): CHOL, HDL, LDLCALC, TRIG, CHOLHDL, LDLDIRECT in the last 72 hours.  Thyroid Function Tests: No results for input(s): TSH, T4TOTAL, FREET4, T3FREE, THYROIDAB in the last 72 hours.  Anemia Panel: No results for input(s): VITAMINB12, FOLATE, FERRITIN, TIBC, IRON, RETICCTPCT in the last 72 hours.  Urine analysis: No results found for: COLORURINE, APPEARANCEUR, LABSPEC, PHURINE, GLUCOSEU, HGBUR, BILIRUBINUR, KETONESUR, PROTEINUR, UROBILINOGEN, NITRITE, LEUKOCYTESUR  Sepsis Labs: Lactic Acid, Venous No results found for:  LATICACIDVEN  MICROBIOLOGY: Recent Results (from the past 240 hour(s))  MRSA PCR Screening     Status: None   Collection Time: 03/29/16  6:56 AM  Result Value Ref Range Status   MRSA by PCR NEGATIVE NEGATIVE Final    Comment:        The GeneXpert MRSA Assay (FDA approved for NASAL specimens only), is one component of a comprehensive MRSA colonization surveillance program. It is not intended to diagnose MRSA infection nor to guide or monitor treatment for MRSA infections.   Urine culture     Status: Abnormal   Collection Time: 03/29/16  9:02 AM  Result Value Ref Range Status   Specimen Description URINE, CATHETERIZED  Final   Special Requests NONE  Final   Culture <10,000 COLONIES/mL INSIGNIFICANT GROWTH (A)  Final   Report Status 03/30/2016 FINAL  Final    RADIOLOGY STUDIES/RESULTS: Dg Chest 2 View  Result Date: 03/29/2016 CLINICAL DATA:  Hypo neck tree knee up. History of pneumonia and bladder malignancy. EXAM: CHEST  2 VIEW COMPARISON:  Portable chest x-ray of March 28, 2016 FINDINGS:  The lungs are slightly less well inflated today. The interstitial markings are coarse. There is no alveolar infiltrate. There is no pleural effusion. The cardiac silhouette is enlarged. The pulmonary vascularity is not engorged. The mediastinum is normal in width. The bony thorax exhibits no acute abnormality. IMPRESSION: Chronic bronchitic changes. Cardiomegaly without pulmonary edema. No evidence of pneumonia. No suspicious pulmonary masses are observed. Electronically Signed   By: David  Martinique M.D.   On: 03/29/2016 14:49     LOS: 4 days   Loni Muse, PA-S  [facility] Pager:[phone]  If 7PM-7AM, please contact night-coverage [email or web address] Password [facility] 04/02/2016, 9:47 AM  Attending MD note  Patient was seen, examined,treatment plan was discussed with the PA-S.  I have personally reviewed the clinical findings, lab, imaging studies and management of this patient in  detail. I agree with the documentation, as recorded by the PA-S.   Patient is doing well, no major complaints. Lying comfortably in bed. Anxious to go home  On Exam: Gen. exam: Awake, alert, not in any distress Chest: Good air entry bilaterally, no rhonchi or rales CVS: S1-S2 regular, no murmurs Abdomen: Soft, nontender and nondistended Neurology: Non-focal Skin: No rash or lesions  Impression: Hyponatremia likely secondary to SIADH Mild acute kidney injury   Plan Continue fluid restriction and low dose Lasix Repeat chemistry panel tomorrow Home on 11/8 if sodium in the high 120s with close follow-up with his primary oncologist  Rest as above  [facility]

## 2016-04-03 ENCOUNTER — Encounter (HOSPITAL_COMMUNITY): Payer: Self-pay | Admitting: Internal Medicine

## 2016-04-03 DIAGNOSIS — N179 Acute kidney failure, unspecified: Secondary | ICD-10-CM

## 2016-04-03 DIAGNOSIS — E222 Syndrome of inappropriate secretion of antidiuretic hormone: Principal | ICD-10-CM

## 2016-04-03 HISTORY — DX: Acute kidney failure, unspecified: N17.9

## 2016-04-03 LAB — BASIC METABOLIC PANEL
Anion gap: 11 (ref 5–15)
BUN: 26 mg/dL — AB (ref 6–20)
CHLORIDE: 96 mmol/L — AB (ref 101–111)
CO2: 22 mmol/L (ref 22–32)
CREATININE: 1.46 mg/dL — AB (ref 0.61–1.24)
Calcium: 9.4 mg/dL (ref 8.9–10.3)
GFR calc Af Amer: 58 mL/min — ABNORMAL LOW (ref >60)
GFR calc non Af Amer: 50 mL/min — ABNORMAL LOW (ref >60)
Glucose, Bld: 106 mg/dL — ABNORMAL HIGH (ref 65–99)
Potassium: 4.6 mmol/L (ref 3.5–5.1)
SODIUM: 129 mmol/L — AB (ref 135–145)

## 2016-04-03 MED ORDER — SODIUM CHLORIDE 1 G PO TABS: 1.0000 g | ORAL_TABLET | Freq: Two times a day (BID) | ORAL | 0 refills | Status: DC

## 2016-04-03 MED ORDER — FUROSEMIDE 20 MG PO TABS: 20.0000 mg | ORAL_TABLET | Freq: Every day | ORAL | 0 refills | Status: DC

## 2016-04-03 MED ORDER — FUROSEMIDE 20 MG PO TABS: 20.0000 mg | ORAL_TABLET | Freq: Every day | ORAL | 0 refills | Status: AC

## 2016-04-03 MED ORDER — SODIUM CHLORIDE 1 G PO TABS: 1.0000 g | ORAL_TABLET | Freq: Two times a day (BID) | ORAL | 0 refills | Status: AC

## 2016-04-03 NOTE — Progress Notes (Signed)
Rayvon Char to be D/C'd Home per MD order.  Discussed with the patient and all questions fully answered.  VSS, Skin clean, dry and intact without evidence of skin break down, no evidence of skin tears noted. IV catheter discontinued intact. Site without signs and symptoms of complications. Dressing and pressure applied.  An After Visit Summary was printed and given to the patient. Patient received prescription.  D/c education completed with patient/family including follow up instructions, medication list, d/c activities limitations if indicated, with other d/c instructions as indicated by MD - patient able to verbalize understanding, all questions fully answered.   Patient instructed to return to ED, call 911, or call MD for any changes in condition.   Patient escorted via [place], and D/C home via private auto.  Luci Bank 04/03/2016 1:02 PM

## 2016-04-03 NOTE — Discharge Summary (Signed)
Physician Discharge Summary  Jaime Singh [id number] DOB: 06-19-1955 DOA: 03/29/2016  PCP: Shirline Frees, MD  Admit date: 03/29/2016 Discharge date: 04/03/2016  Admitted From: Home --[facility] --> [facility]  Disposition:  Home   Recommendations for Outpatient Follow-up:  1. Follow up with PCP in 1-2 weeks 2. Follow up with Oncologist next Tuesday/Wednesday as previously scheduled.  3. Please obtain BMP/CBC in one week.  4. Stop cozaar due to acute kidney injury 5. Will hold Keytruda due to hyponatremia until follow up with oncology.  6. Start lasix, sodium chloride tab.  7. You should be compliant with fluid restriction of 1,264mL daily.   Home Health: No  Equipment/Devices: None   Discharge Condition: Stable CODE STATUS: Full  Diet recommendation: Heart healthy   Brief/Interim Summary: From H&P: Jaime Singh is a 60 y.o. male with medical history significant for HTN, history of bilateral lung nodules on CT, history of Stage 4 bladder cancer on immunotherapy with Keytruda, transferred from [facility] for the management of hyponatremia with a Sodium of 115. This sodium had been followed at [facility]. Per chart report, this was about 129 2 weeks ago. He was recommended to take salt tablets 4 times a day and recheck. Patient reports this dropped to 118, then to 115, for which he was brought to the hospital for its management. Denies fevers, chills, night sweats, double vision. Denies any respiratory complaints. Denies any chest pain or palpitations. Denies lower extremity swelling. Denies nausea, heartburn or change in bowel habits. Denies abdominal pain. Appetite is normal but he reports increased thirst, drinking about 6 glasses of water . Denies any dysuria or hematuria. He reports recent UTI about 2 weeks ago with Cipro. Denies abnormal skin rashes, he has chronic neuropathy. He does report frequent leg cramps.  Denies any bleeding issues such as  epistaxis, hematemesis, or hematochezia.  Reports intermittent headaches but no seizures or confusion or memory loss. No recent travel.  Ambulating without difficulty. Of note, he was on HCTZ per chart, but patient does not recall taking it. No tobacco or alcohol  Interim: Patient was admitted to [facility] for hyponatremia in setting of malignancy, Keytruda, diuretic, increased free water intake. Hyponatremia with high suspicion for SIADH. IVF was stopped, placed on fluid restriction, and given lasix. Lasix was stopped due to AKI, and with improvement, oral lasix was restarted.   Subjective on day of discharge: Doing well today. No complaints. Denies chest pain, shortness of breath, n/v/d.   Discharge Diagnoses:  Principal Problem:   SIADH (syndrome of inappropriate ADH production) ([place]) Active Problems:   Bladder cancer (HCC)   Malignant neoplasm of overlapping sites of bladder [facility])   Essential hypertension   Lung nodules   Hyponatremia   AKI (acute kidney injury) (HCC)   Euvolemic hypotonic hyponatremia: Slowly improving with Lasix/Fluid restriction-continues to be euvolemic, high suspicion for SIADH. Na today 129. On lasix 20mg  daily.    AKI: probably 2/2 to Lasix and fluid restriction. May need to tolerate some amount of AKI. Suspect that labs will equilibrate and creatinine will improve with time. Currently Cr 1.46 which has remained stable over last 2-3 days. Stop cozaar.   Leukocytosis: No indication of infection, follow.  Metastatic bladder cancer:On immunotherapy-[facility].   Essential hypertension: Hold cozaar. BP currently controlled.   Discharge Instructions  Discharge Instructions    Diet - low sodium heart healthy    Complete by:  As directed  Discharge instructions    Complete by:  As directed    Recommendations for Outpatient Follow-up:  Follow up with PCP in 1-2 weeks Follow up with Oncologist next Tuesday/Wednesday as  previously scheduled.  Please obtain BMP/CBC in one week.  Stop cozaar due to acute kidney injury Will hold Keytruda due to hyponatremia until follow up with oncology.  Start lasix, sodium chloride tab.  You should be compliant with fluid restriction of 1,250mL daily.   Increase activity slowly    Complete by:  As directed        Medication List    STOP taking these medications   losartan 50 MG tablet Commonly known as:  COZAAR   phenazopyridine 200 MG tablet Commonly known as:  PYRIDIUM   trimethoprim 100 MG tablet Commonly known as:  TRIMPEX   ZITHROMAX Z-PAK 250 MG tablet Generic drug:  azithromycin     TAKE these medications   furosemide 20 MG tablet Commonly known as:  LASIX Take 1 tablet (20 mg total) by mouth daily. Start taking on:  04/04/2016   PRESCRIPTION MEDICATION Patient goes to [facility], in [place].  to get treatments. Patient's on every 3 weeks cycle. Last treatment, 2 weeks ago. ( next treatment's due the week of 11-6)   sodium chloride 1 g tablet Take 1 tablet (1 g total) by mouth 2 (two) times daily with a meal.   traMADol-acetaminophen 37.5-325 MG tablet Commonly known as:  ULTRACET Take 1 tablet by mouth every 6 (six) hours as needed.   vitamin C 500 MG tablet Commonly known as:  ASCORBIC ACID Take 500 mg by mouth daily.      Follow-up Information    Shirline Frees, MD. Schedule an appointment as soon as possible for a visit in 1 week(s).   Specialty:  Family Medicine Contact information: [address] [place] [address] [place] Follow up.   Why:  Next week as previously scheduled          No Known Allergies  Consultations:  None   Procedures/Studies: Dg Chest 2 View  Result Date: 03/29/2016 CLINICAL DATA:  Hypo neck tree knee up. History of pneumonia and bladder malignancy. EXAM: CHEST  2 VIEW COMPARISON:  Portable chest x-ray of March 28, 2016  FINDINGS: The lungs are slightly less well inflated today. The interstitial markings are coarse. There is no alveolar infiltrate. There is no pleural effusion. The cardiac silhouette is enlarged. The pulmonary vascularity is not engorged. The mediastinum is normal in width. The bony thorax exhibits no acute abnormality. IMPRESSION: Chronic bronchitic changes. Cardiomegaly without pulmonary edema. No evidence of pneumonia. No suspicious pulmonary masses are observed. Electronically Signed   By: David  Martinique M.D.   On: 03/29/2016 14:49    Discharge Exam: Vitals:   04/02/16 2050 04/03/16 0515  BP: 135/79 127/67  Pulse: (!) 107 (!) 104  Resp: 18 18  Temp: 99.2 F (37.3 C) 99.2 F (37.3 C)   Vitals:   04/02/16 0558 04/02/16 1525 04/02/16 2050 04/03/16 0515  BP:  131/73 135/79 127/67  Pulse:  (!) 113 (!) 107 (!) 104  Resp:  18 18 18   Temp:  98.9 F (37.2 C) 99.2 F (37.3 C) 99.2 F (37.3 C)  TempSrc:  Oral Oral Oral  SpO2:  94% 96% 94%  Weight: 104.9 kg (231 lb 4.2 oz)   104.9 kg (231 lb 4.2 oz)  Height:  General: Pt is alert, awake, not in acute distress Cardiovascular: RRR, S1/S2 +, no rubs, no gallops Respiratory: CTA bilaterally, no wheezing, no rhonchi Abdominal: Soft, NT, ND, bowel sounds + Extremities: no edema, no cyanosis    The results of significant diagnostics from this hospitalization (including imaging, microbiology, ancillary and laboratory) are listed below for reference.     Microbiology: Recent Results (from the past 240 hour(s))  MRSA PCR Screening     Status: None   Collection Time: 03/29/16  6:56 AM  Result Value Ref Range Status   MRSA by PCR NEGATIVE NEGATIVE Final    Comment:        The GeneXpert MRSA Assay (FDA approved for NASAL specimens only), is one component of a comprehensive MRSA colonization surveillance program. It is not intended to diagnose MRSA infection nor to guide or monitor treatment for MRSA infections.   Urine culture      Status: Abnormal   Collection Time: 03/29/16  9:02 AM  Result Value Ref Range Status   Specimen Description URINE, CATHETERIZED  Final   Special Requests NONE  Final   Culture <10,000 COLONIES/mL INSIGNIFICANT GROWTH (A)  Final   Report Status 03/30/2016 FINAL  Final     Labs: BNP (last 3 results) No results for input(s): BNP in the last 8760 hours. Basic Metabolic Panel:  Recent Labs Lab 03/31/16 2116 04/01/16 0624 04/01/16 1533 04/02/16 0522 04/03/16 0638  NA 121* 122* 125* 125* 129*  K 4.0 4.1 4.4 4.5 4.6  CL 89* 90* 91* 94* 96*  CO2 22 20* 23 21* 22  GLUCOSE 131* 106* 127* 107* 106*  BUN 20 20 22* 24* 26*  CREATININE 1.52* 1.43* 1.46* 1.42* 1.46*  CALCIUM 9.4 9.5 9.5 9.5 9.4   Liver Function Tests:  Recent Labs Lab 03/29/16 0739 03/30/16 0107  AST 27 25  ALT 27 27  ALKPHOS 97 89  BILITOT 0.9 0.7  PROT 7.0 6.6  ALBUMIN 3.9 3.6   No results for input(s): LIPASE, AMYLASE in the last 168 hours. No results for input(s): AMMONIA in the last 168 hours. CBC:  Recent Labs Lab 03/29/16 0739 03/30/16 0107 04/02/16 0522  WBC 10.7* 8.9 14.6*  NEUTROABS 7.9*  --   --   HGB 12.8* 12.2* 14.0  HCT 34.6* 33.2* 39.2  MCV 81.0 80.4 84.1  PLT 350 326 330   Cardiac Enzymes: No results for input(s): CKTOTAL, CKMB, CKMBINDEX, TROPONINI in the last 168 hours. BNP: Invalid input(s): POCBNP CBG: No results for input(s): GLUCAP in the last 168 hours. D-Dimer No results for input(s): DDIMER in the last 72 hours. Hgb A1c No results for input(s): HGBA1C in the last 72 hours. Lipid Profile No results for input(s): CHOL, HDL, LDLCALC, TRIG, CHOLHDL, LDLDIRECT in the last 72 hours. Thyroid function studies No results for input(s): TSH, T4TOTAL, T3FREE, THYROIDAB in the last 72 hours.  Invalid input(s): FREET3 Anemia work up No results for input(s): VITAMINB12, FOLATE, FERRITIN, TIBC, IRON, RETICCTPCT in the last 72 hours. Urinalysis No results found for:  COLORURINE, APPEARANCEUR, [place], [place], [place], [place], [place], [place], PROTEINUR, UROBILINOGEN, NITRITE, LEUKOCYTESUR Sepsis Labs Invalid input(s): PROCALCITONIN,  WBC,  LACTICIDVEN Microbiology Recent Results (from the past 240 hour(s))  MRSA PCR Screening     Status: None   Collection Time: 03/29/16  6:56 AM  Result Value Ref Range Status   MRSA by PCR NEGATIVE NEGATIVE Final    Comment:        The GeneXpert MRSA Assay (FDA approved for  NASAL specimens only), is one component of a comprehensive MRSA colonization surveillance program. It is not intended to diagnose MRSA infection nor to guide or monitor treatment for MRSA infections.   Urine culture     Status: Abnormal   Collection Time: 03/29/16  9:02 AM  Result Value Ref Range Status   Specimen Description URINE, CATHETERIZED  Final   Special Requests NONE  Final   Culture <10,000 COLONIES/mL INSIGNIFICANT GROWTH (A)  Final   Report Status 03/30/2016 FINAL  Final     Time coordinating discharge: Over 30 minutes  SIGNED:  Dessa Phi, DO [facility] Pager [phone]  If 7PM-7AM, please contact night-coverage [email or web address] Password [email or web address] 04/03/2016, 11:55 AM

## 2016-04-03 NOTE — Care Management Note (Signed)
Case Management Note  Patient Details  Name: Jaime Singh MRN: [id number] Date of Birth: [birth year]  Subjective/Objective:                  Patient from home with wife From [place], admitted with hyponatremia, mild AKI. Currently being treated for bladder CA at [facility] in [place]. Patient flies out of [place] for treatment. Sodium slowly improving, no CM needs identified.     Action/Plan:  DC to home self care with wife.  Expected Discharge Date:                  Expected Discharge Plan:  Home/Self Care  In-House Referral:  NA  Discharge planning Services  CM Consult  Post Acute Care Choice:    Choice offered to:     DME Arranged:    DME Agency:     HH Arranged:    HH Agency:     Status of Service:  Completed, signed off  If discussed at [employer] of Stay Meetings, dates discussed:    Additional Comments:  Carles Collet, RN 04/03/2016, 12:46 PM

## 2017-04-26 DEATH — deceased

## 2018-06-25 IMAGING — DX DG CHEST 2V
2 series · 2 of 2 positions shown · non-contrast
Comparison: Portable chest x-ray March 28, 2016

CLINICAL DATA: Hypo neck tree knee up. History of pneumonia and
bladder malignancy.

EXAM:
CHEST  2 VIEW

[chest pa]
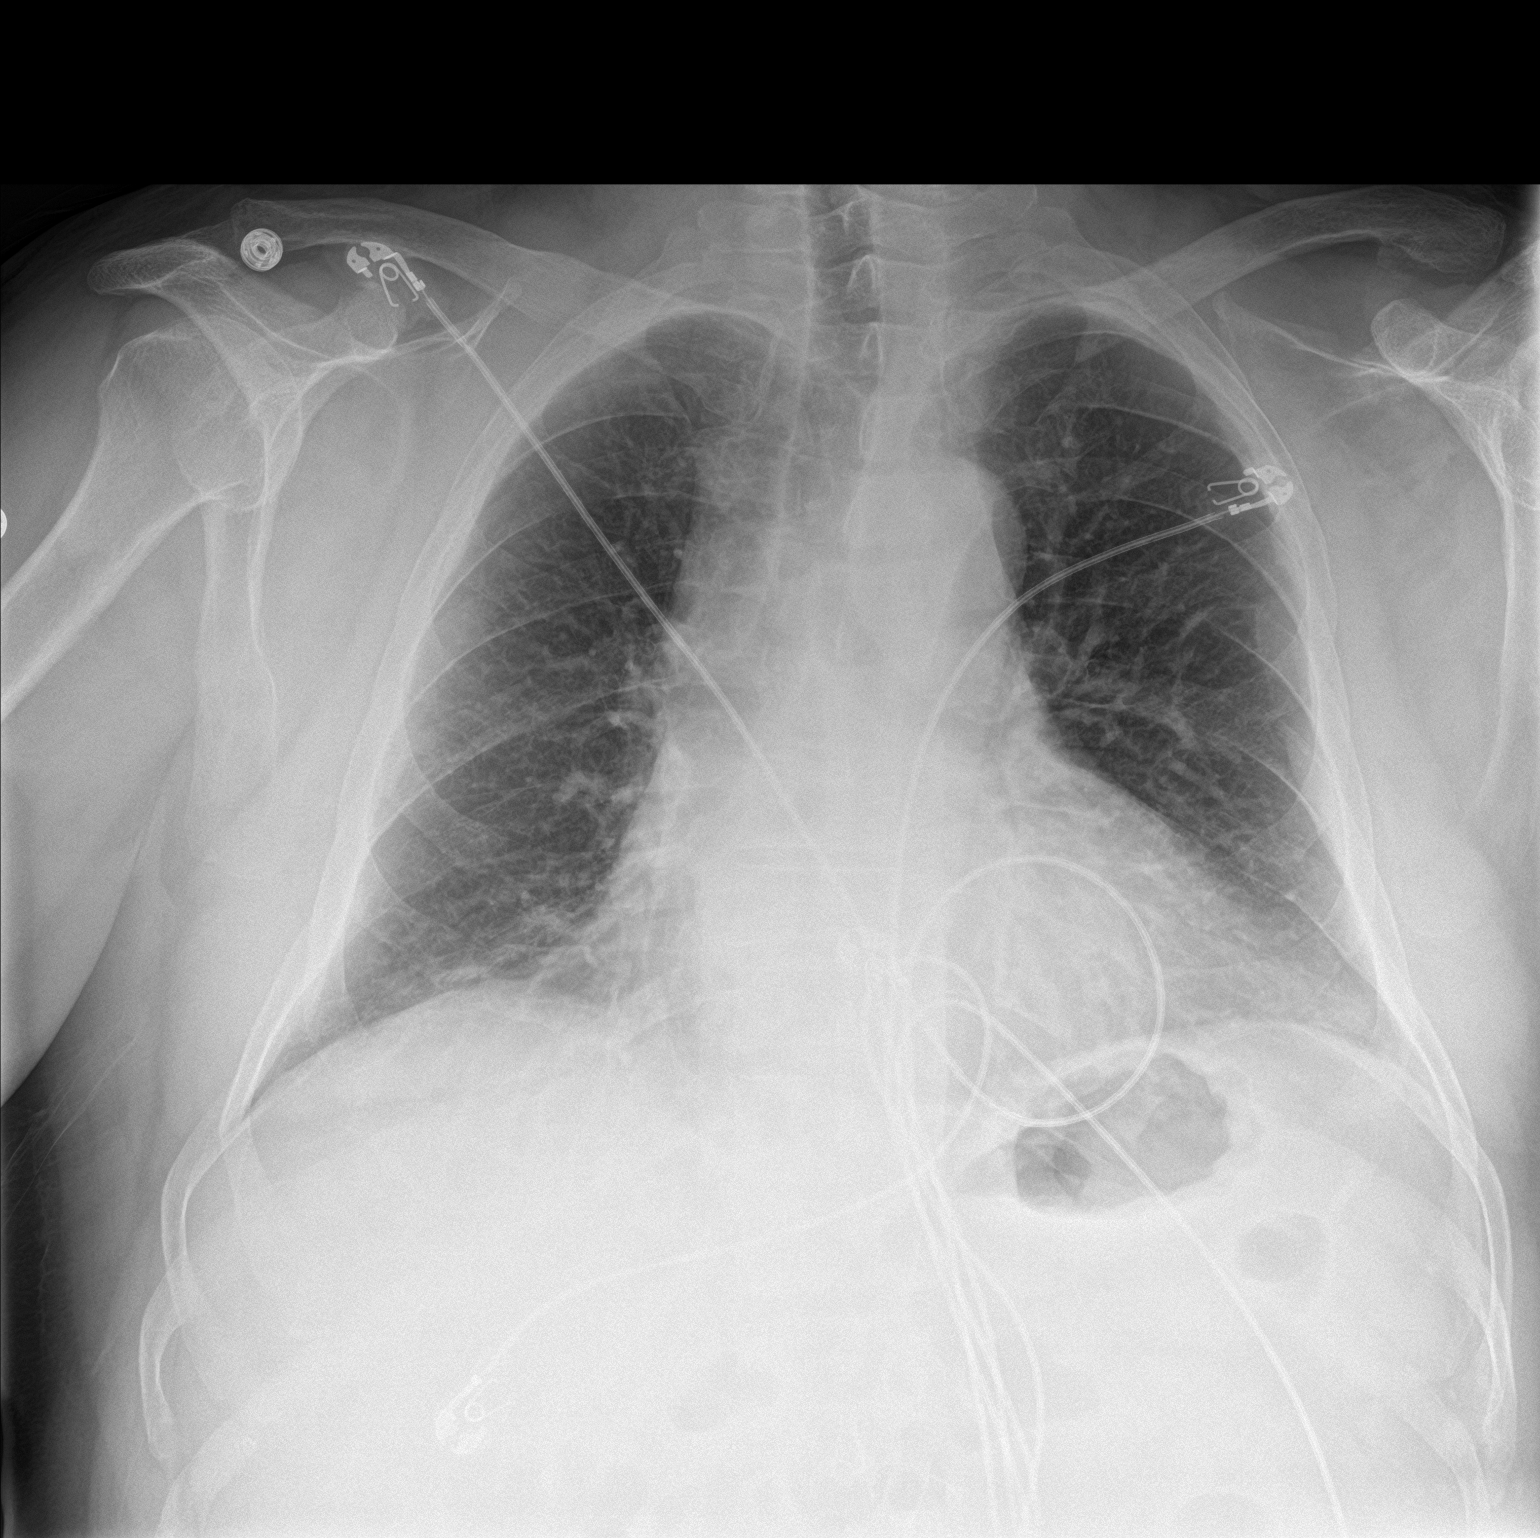

[chest lat]
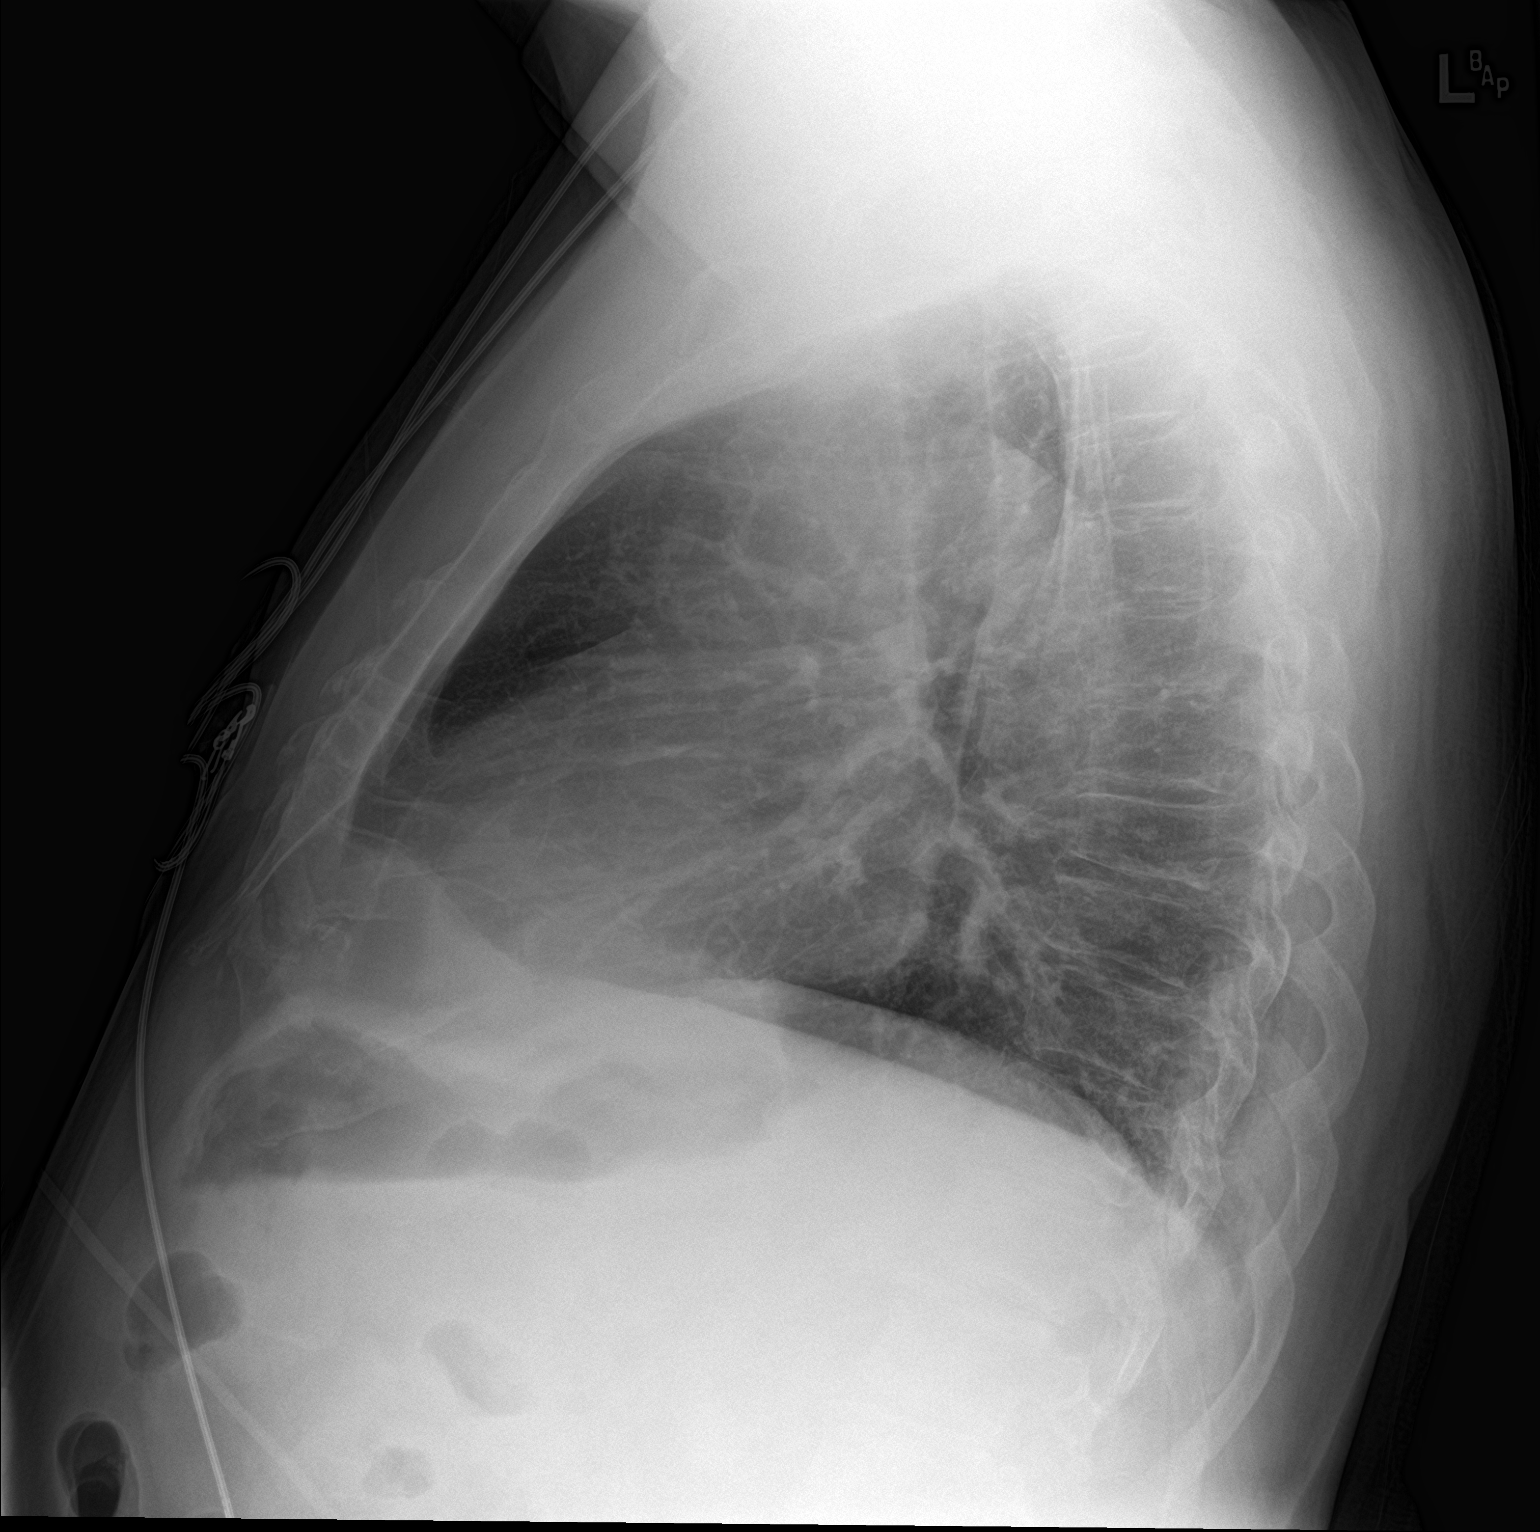

[2 of 2 positions shown; findings below may reference images not displayed]

FINDINGS: The lungs are slightly less well inflated today. The interstitial
markings are coarse. There is no alveolar infiltrate. There is no
pleural effusion. The cardiac silhouette is enlarged. The pulmonary
vascularity is not engorged. The mediastinum is normal in width. The
bony thorax exhibits no acute abnormality.
IMPRESSION: Chronic bronchitic changes. Cardiomegaly without pulmonary edema. No
evidence of pneumonia. No suspicious pulmonary masses are observed.
